# Patient Record
Sex: Male | Born: 1960
Health system: Southern US, Community
[De-identification: ages and names within clinical notes are randomized; demographics above are authoritative.]

## PROBLEM LIST (undated history)

## (undated) DIAGNOSIS — K5190 Ulcerative colitis, unspecified, without complications: Secondary | ICD-10-CM

## (undated) HISTORY — DX: Ulcerative colitis, unspecified, without complications: K51.90

---

## 2011-01-26 ENCOUNTER — Encounter (INDEPENDENT_AMBULATORY_CARE_PROVIDER_SITE_OTHER): Payer: Self-pay | Admitting: *Deleted

## 2011-02-02 NOTE — Letter (Signed)
Summary: New Patient letter  [facility]  [address] [place], [place] [address]   Phone: [phone]  Fax: [phone]       01/26/2011 MRN: [id number]  Christopher Haas [address] [place], [place]  [address]  Dear Christopher Haas,  Welcome to the Gastroenterology Division at [facility].    You are scheduled to see Dr.  Sheryn Bison on February 25, 2011 at 9:30am on the 3rd floor at [facility], [address]  We ask that you try to arrive at our office 15 minutes prior to your appointment time to allow for check-in.  We would like you to complete the enclosed self-administered evaluation form prior to your visit and bring it with you on the day of your appointment.  We will review it with you.  Also, please bring a complete list of all your medications or, if you prefer, bring the medication bottles and we will list them.  Please bring your insurance card so that we may make a copy of it.  If your insurance requires a referral to see a specialist, please bring your referral form from your primary care physician.  Co-payments are due at the time of your visit and may be paid by cash, check or credit card.     Your office visit will consist of a consult with your physician (includes a physical exam), any laboratory testing he/she may order, scheduling of any necessary diagnostic testing (e.g. x-ray, ultrasound, CT-scan), and scheduling of a procedure (e.g. Endoscopy, Colonoscopy) if required.  Please allow enough time on your schedule to allow for any/all of these possibilities.    If you cannot keep your appointment, please call [phone] to cancel or reschedule prior to your appointment date.  This allows [place] the opportunity to schedule an appointment for another patient in need of care.  If you do not cancel or reschedule by 5 p.m. the business day prior to your appointment date, you will be charged a $50.00 late cancellation/no-show fee.    Thank you for  choosing [facility] for your medical needs.  We appreciate the opportunity to care for you.  Please visit [place] at our website  to learn more about our practice.                     Sincerely,                                                             The Gastroenterology Division

## 2011-02-25 ENCOUNTER — Ambulatory Visit: Payer: Self-pay | Admitting: Gastroenterology

## 2011-06-15 DIAGNOSIS — K5100 Ulcerative (chronic) pancolitis without complications: Secondary | ICD-10-CM | POA: Insufficient documentation

## 2013-06-06 DIAGNOSIS — R1310 Dysphagia, unspecified: Secondary | ICD-10-CM | POA: Insufficient documentation

## 2013-08-25 ENCOUNTER — Ambulatory Visit (INDEPENDENT_AMBULATORY_CARE_PROVIDER_SITE_OTHER): Payer: BC Managed Care – PPO | Admitting: Family Medicine

## 2013-08-25 VITALS — BP 102/63 | HR 66 | Temp 97.1°F

## 2013-08-25 DIAGNOSIS — K5190 Ulcerative colitis, unspecified, without complications: Secondary | ICD-10-CM | POA: Insufficient documentation

## 2013-08-25 DIAGNOSIS — R059 Cough, unspecified: Secondary | ICD-10-CM

## 2013-08-25 DIAGNOSIS — J209 Acute bronchitis, unspecified: Secondary | ICD-10-CM | POA: Insufficient documentation

## 2013-08-25 DIAGNOSIS — R05 Cough: Secondary | ICD-10-CM

## 2013-08-25 DIAGNOSIS — J029 Acute pharyngitis, unspecified: Secondary | ICD-10-CM

## 2013-08-25 LAB — POCT INFLUENZA A/B
Influenza A, POC: NEGATIVE
Influenza B, POC: NEGATIVE

## 2013-08-25 LAB — POCT RAPID STREP A (OFFICE): Rapid Strep A Screen: NEGATIVE

## 2013-08-25 MED ORDER — AZITHROMYCIN 250 MG PO TABS: ORAL_TABLET | ORAL | Status: DC

## 2013-08-25 MED ORDER — GUAIFENESIN-CODEINE 100-10 MG/5ML PO SYRP: 5.0000 mL | ORAL_SOLUTION | Freq: Four times a day (QID) | ORAL | Status: DC | PRN

## 2013-08-25 NOTE — Progress Notes (Signed)
Patient ID: Christopher Haas, male   DOB: Dec 18, 1960, 52 y.o.   MRN: [id number] SUBJECTIVE: CC: Chief Complaint  Patient presents with  . Acute Visit    cough drainage sore throat    HPI: Lots of coughing, sorethroat,. No fever. Doesn't smoke.SOB : none No Chest pain. Cough worse at nights. This occurs every year at least once.   No past medical history on file. No past surgical history on file. History   Social History  . Marital Status: Married    Spouse Name: N/A    Number of Children: N/A  . Years of Education: N/A   Occupational History  . Not on file.   Social History Main Topics  . Smoking status: Not on file  . Smokeless tobacco: Not on file  . Alcohol Use: Not on file  . Drug Use: Not on file  . Sexual Activity: Not on file   Other Topics Concern  . Not on file   Social History Narrative  . No narrative on file   No family history on file. No current outpatient prescriptions on file prior to visit.   No current facility-administered medications on file prior to visit.   No Known Allergies  There is no immunization history on file for this patient. Prior to Admission medications   Medication Sig Start Date End Date Taking? Authorizing Provider  folic acid (FOLVITE) 1 MG tablet Take 1 mg by mouth daily.   Yes Historical Provider, MD  methotrexate (RHEUMATREX) 2.5 MG tablet Take by mouth. 10 tablets once a week   Yes Historical Provider, MD     ROS: As above in the HPI. All other systems are stable or negative.  OBJECTIVE: APPEARANCE:  Patient in no acute distress.The patient appeared well nourished and normally developed. Acyanotic. Waist: VITAL SIGNS:BP 102/63  Pulse 66  Temp(Src) 97.1 F (36.2 C) Hispanic male  SKIN: warm and  Dry without overt rashes, tattoos and scars  HEAD and Neck: without JVD, Head and scalp: normal Eyes:No scleral icterus. Fundi normal, eye movements normal. Ears: Auricle normal, canal normal, Tympanic membranes  normal, insufflation normal. Nose: normal Throat: red, no exudates Neck & thyroid: normal  CHEST & LUNGS: Chest wall: normal Lungs: Coarse BS, scattered rhonchi, no Rales  CVS: Reveals the PMI to be normally located. Regular rhythm, First and Second Heart sounds are normal,  absence of murmurs, rubs or gallops. Peripheral vasculature: Radial pulses: normal Dorsal pedis pulses: normal Posterior pulses: normal  ABDOMEN:  Appearance: normal Benign, no organomegaly, no masses, no Abdominal Aortic enlargement. No Guarding , no rebound. No Bruits. Bowel sounds: normal  RECTAL: N/A GU: N/A  EXTREMETIES: nonedematous.  MUSCULOSKELETAL:  Spine: normal Joints: intact  NEUROLOGIC: oriented to time,place and person; nonfocal. Strength is normal Sensory is normal Reflexes are normal Cranial Nerves are normal. Results for orders placed in visit on 08/25/13  POCT INFLUENZA A/B      Result Value Range   Influenza A, POC Negative     Influenza B, POC Negative    POCT RAPID STREP A (OFFICE)      Result Value Range   Rapid Strep A Screen Negative  Negative     ASSESSMENT: Cough - Plan: Influenza A/B  Sore throat - Plan: Influenza A/B, Rapid Strep A  Acute bronchitis - Plan: azithromycin (ZITHROMAX Z-PAK) 250 MG tablet, guaiFENesin-codeine (ROBITUSSIN AC) 100-10 MG/5ML syrup   PLAN:  Orders Placed This Encounter  Procedures  . Influenza A/B  . Rapid Strep A  Meds ordered this encounter  Medications  . folic acid (FOLVITE) 1 MG tablet    Sig: Take 1 mg by mouth daily.  . methotrexate (RHEUMATREX) 2.5 MG tablet    Sig: Take by mouth. 10 tablets once a week  . azithromycin (ZITHROMAX Z-PAK) 250 MG tablet    Sig: 2 tabs on day1 then 1 tab on day 2 to 5.    Dispense:  6 each    Refill:  0  . guaiFENesin-codeine (ROBITUSSIN AC) 100-10 MG/5ML syrup    Sig: Take 5 mLs by mouth 4 (four) times daily as needed for cough.    Dispense:  120 mL    Refill:  0     Discussed diet.  Return if symptoms worsen or fail to improve.  Charman Blasco P. Modesto Charon, M.D.

## 2013-09-04 ENCOUNTER — Ambulatory Visit (INDEPENDENT_AMBULATORY_CARE_PROVIDER_SITE_OTHER): Payer: BC Managed Care – PPO

## 2013-09-04 DIAGNOSIS — Z23 Encounter for immunization: Secondary | ICD-10-CM

## 2014-02-18 ENCOUNTER — Telehealth: Payer: Self-pay | Admitting: Family Medicine

## 2014-02-18 NOTE — Telephone Encounter (Signed)
Patient wanted late afternoon appt and i told them the first available was Wed with Christopher Haas appt given

## 2014-02-20 ENCOUNTER — Ambulatory Visit (INDEPENDENT_AMBULATORY_CARE_PROVIDER_SITE_OTHER): Payer: BC Managed Care – PPO | Admitting: Family Medicine

## 2014-02-20 ENCOUNTER — Encounter: Payer: Self-pay | Admitting: Family Medicine

## 2014-02-20 VITALS — BP 96/57 | HR 64 | Temp 97.7°F | Ht 67.0 in | Wt 165.2 lb

## 2014-02-20 DIAGNOSIS — J029 Acute pharyngitis, unspecified: Secondary | ICD-10-CM

## 2014-02-20 DIAGNOSIS — J302 Other seasonal allergic rhinitis: Secondary | ICD-10-CM

## 2014-02-20 DIAGNOSIS — R4789 Other speech disturbances: Secondary | ICD-10-CM

## 2014-02-20 DIAGNOSIS — J309 Allergic rhinitis, unspecified: Secondary | ICD-10-CM

## 2014-02-20 DIAGNOSIS — R4702 Dysphasia: Secondary | ICD-10-CM

## 2014-02-20 DIAGNOSIS — J209 Acute bronchitis, unspecified: Secondary | ICD-10-CM

## 2014-02-20 MED ORDER — DEXLANSOPRAZOLE 60 MG PO CPDR: 60.0000 mg | DELAYED_RELEASE_CAPSULE | Freq: Every day | ORAL | Status: DC

## 2014-02-20 MED ORDER — DESLORATADINE-PSEUDOEPHED ER 5-240 MG PO TB24: 1.0000 | ORAL_TABLET | Freq: Every day | ORAL | Status: DC

## 2014-02-20 MED ORDER — AZITHROMYCIN 250 MG PO TABS: ORAL_TABLET | ORAL | Status: DC

## 2014-02-20 NOTE — Progress Notes (Signed)
   Subjective:    Patient ID: Christopher Haas, male    DOB: 1960-12-02, 53 y.o.   MRN: [id number]  HPI  This 53 y.o. male presents for evaluation of sore throat.  He has been having some allergies. He c/o globulus sensation and dysphasia.  He c/o dryness in his throat.  Review of Systems No chest pain, SOB, HA, dizziness, vision change, N/V, diarrhea, constipation, dysuria, urinary urgency or frequency, myalgias, arthralgias or rash.     Objective:   Physical Exam  Vital signs noted  Well developed well nourished male.  HEENT - Head atraumatic Normocephalic                Eyes - PERRLA, Conjuctiva - clear Sclera- Clear EOMI                Ears - EAC's Wnl TM's Wnl Gross Hearing WNL                Nose - Nares patent                 Throat - oropharanx wnl Respiratory - Lungs CTA bilateral Cardiac - RRR S1 and S2 without murmur GI - Abdomen soft Nontender and bowel sounds active x 4 Extremities - No edema. Neuro - Grossly intact.      Assessment & Plan:  Acute bronchitis  Dysphasia - Plan: dexlansoprazole (DEXILANT) 60 MG capsule  Seasonal allergies - Plan: azithromycin (ZITHROMAX Z-PAK) 250 MG tablet, desloratadine-pseudoephedrine (CLARINEX-D 24 HOUR) 5-240 MG per 24 hr tablet  Acute pharyngitis - Plan: azithromycin (ZITHROMAX Z-PAK) 250 MG tablet  Deatra CanterWilliam J Con Arganbright FNP

## 2014-08-28 ENCOUNTER — Ambulatory Visit (INDEPENDENT_AMBULATORY_CARE_PROVIDER_SITE_OTHER): Payer: BC Managed Care – PPO

## 2014-08-28 DIAGNOSIS — Z23 Encounter for immunization: Secondary | ICD-10-CM

## 2015-02-18 ENCOUNTER — Encounter: Payer: Self-pay | Admitting: Family

## 2015-02-18 ENCOUNTER — Ambulatory Visit: Payer: Self-pay | Admitting: Family

## 2015-02-18 ENCOUNTER — Encounter (INDEPENDENT_AMBULATORY_CARE_PROVIDER_SITE_OTHER): Payer: Self-pay

## 2015-02-18 ENCOUNTER — Ambulatory Visit (INDEPENDENT_AMBULATORY_CARE_PROVIDER_SITE_OTHER): Payer: BLUE CROSS/BLUE SHIELD | Admitting: Family

## 2015-02-18 VITALS — BP 105/67 | HR 82 | Temp 98.7°F | Ht 67.0 in | Wt 163.2 lb

## 2015-02-18 DIAGNOSIS — J069 Acute upper respiratory infection, unspecified: Secondary | ICD-10-CM

## 2015-02-18 MED ORDER — AZITHROMYCIN 250 MG PO TABS: ORAL_TABLET | ORAL | Status: DC

## 2015-02-18 MED ORDER — HYDROCODONE-HOMATROPINE 5-1.5 MG/5ML PO SYRP: 5.0000 mL | ORAL_SOLUTION | Freq: Three times a day (TID) | ORAL | Status: DC | PRN

## 2015-02-18 MED ORDER — BENZONATATE 200 MG PO CAPS: 200.0000 mg | ORAL_CAPSULE | Freq: Three times a day (TID) | ORAL | Status: DC | PRN

## 2015-02-18 NOTE — Progress Notes (Signed)
Subjective:    Patient ID: Christopher Haas, male    DOB: [birth year], 54 y.o.   MRN: [id number]  Otalgia  Associated symptoms include coughing, headaches and rhinorrhea. Pertinent negatives include no sore throat.  Cough This is a new problem. The current episode started in the past 7 days (Saturday evening). The problem has been waxing and waning. The problem occurs every few minutes. The cough is productive of blood-tinged sputum. Associated symptoms include chills, ear pain, headaches, myalgias, nasal congestion, postnasal drip and rhinorrhea. Pertinent negatives include no fever, sore throat, shortness of breath or wheezing. He has tried rest and OTC cough suppressant for the symptoms. The treatment provided mild relief. There is no history of asthma or COPD.  Back Pain Associated symptoms include headaches. Pertinent negatives include no fever.      Review of Systems  Constitutional: Positive for chills. Negative for fever.  HENT: Positive for ear pain, postnasal drip and rhinorrhea. Negative for sore throat.   Respiratory: Positive for cough. Negative for shortness of breath and wheezing.   Cardiovascular: Negative.   Gastrointestinal: Negative.   Endocrine: Negative.   Genitourinary: Negative.   Musculoskeletal: Positive for myalgias and back pain.  Neurological: Positive for headaches.  Hematological: Negative.   Psychiatric/Behavioral: Negative.   All other systems reviewed and are negative.      Objective:   Physical Exam  Constitutional: He is oriented to person, place, and time. He appears well-developed and well-nourished. No distress.  HENT:  Head: Normocephalic.  Right Ear: External ear normal.  Left Ear: External ear normal.  Nasal passage erythemas with mild swelling  Oropharynx erythemas  Eyes: Pupils are equal, round, and reactive to light. Right eye exhibits no discharge. Left eye exhibits no discharge.  Neck: Normal range of motion. Neck supple. No  thyromegaly present.  Cardiovascular: Normal rate, regular rhythm, normal heart sounds and intact distal pulses.   No murmur heard. Pulmonary/Chest: Effort normal and breath sounds normal. No respiratory distress. He has no wheezes.  Abdominal: Soft. Bowel sounds are normal. He exhibits no distension. There is no tenderness.  Musculoskeletal: Normal range of motion. He exhibits no edema or tenderness.  Neurological: He is alert and oriented to person, place, and time. He has normal reflexes. No cranial nerve deficit.  Skin: Skin is warm and dry. No rash noted. No erythema.  Psychiatric: He has a normal mood and affect. His behavior is normal. Judgment and thought content normal.  Vitals reviewed.     BP 105/67 mmHg  Pulse 82  Temp(Src) 98.7 F (37.1 C) (Oral)  Ht  (1.702 m)  Wt 163 lb 3.2 oz (74.027 kg)  BMI 25.55 kg/m2  SpO2 97%     Assessment & Plan:  1. Acute upper respiratory infection - Take meds as prescribed - Use a cool mist humidifier  -Use saline nose sprays frequently -Saline irrigations of the nose can be very helpful if done frequently.  * 4X daily for 1 week*  * Use of a nettie pot can be helpful with this. Follow directions with this* -Force fluids -For any cough or congestion  Use plain Mucinex- regular strength or max strength is fine   * Children- consult with Pharmacist for dosing -For fever or aces or pains- take tylenol or ibuprofen appropriate for age and weight.  * for fevers greater than 101 orally you may alternate ibuprofen and tylenol every  3 hours. -Throat lozenges if help - azithromycin (ZITHROMAX) 250 MG tablet; Take 500 mg  once, then 250 mg for four days  Dispense: 6 tablet; Refill: 0 - benzonatate (TESSALON) 200 MG capsule; Take 1 capsule (200 mg total) by mouth 3 (three) times daily as needed.  Dispense: 30 capsule; Refill: 1 - HYDROcodone-homatropine (HYCODAN) 5-1.5 MG/5ML syrup; Take 5 mLs by mouth every 8 (eight) hours as needed for  cough.  Dispense: 120 mL; Refill: 0  Jannifer Rodneyhristy Kemoni Ortega, FNP

## 2015-02-18 NOTE — Patient Instructions (Addendum)
Infeccin de las vas areas superiores en los adultos (Upper Respiratory Infection, Adult)  La infeccin respiratoria de las vas areas superiores se conoce tambin como resfro comn. Las vas areas superiores incluyen los senos nasales, la garganta, la trquea, y los bronquios. Los bronquios son las vas areas que conducen el aire a los pulmones. La mayor parte de las personas mejora luego de una semana, pero los sntomas pueden durar hasta dos semanas. La tos residual puede durar ms. CAUSAS Varios tipos de virus pueden causar la infeccin de los tejidos que cubren las vas areas superiores. Los tejidos se irritan y se inflaman y se originan secreciones. Tambin es frecuente la produccin de moco. El resfro es contagioso. El virus se disemina fcilmente a otras personas por contacto oral. Aqu se incluyen los besos, el compartir un vaso y el toser o estornudar. Tambin puede diseminarse tocndose la boca o la nariz y luego tocando una superficie que luego tocan otras personas.  SNTOMAS Los sntomas se desarrollan entre uno y tres das luego de entrar en contacto con el virus. Pueden variar de una persona a otra. Incluyen:  Secrecin nasal.  Estornudos  Congestin nasal.  Irritacin de los senos nasales.  Dolor de garganta.  Prdida de la voz (laringitis).  Tos.  Fatiga.  Dolores musculares.  Prdida del apetito.  Dolor de cabeza.  Fiebre no muy elevada. DIAGNSTICO Puede diagnosticarse a s mismo la infeccin respiratoria, segn los sntomas habituales, ya que la mayor parte de las personas se resfra dos o tres veces al ao. El profesional puede confirmarlo basndose en el examen fsico. Lo ms importante es que el profesional verifique que los sntomas no se deben a otra enfermedad como anginas, sinusitis, neumona, asma o epiglotitis. Para diagnosticar el resfrio comn, no es necesario que haga anlisis de sangre, pruebas en la garganta o radiografas, pero en algunos  casos puede ser de utilidad para excluir otros problemas ms graves. El mdico decidir si necesita otras pruebas. RIESGOS Y COMPLICACIONES Tendr mayor riesgo de sufrir un resfro grave si consume cigarrillos, sufre una enfermedad cardaca (como insuficiencia cardaca) o pulmonar crnica (como asma) o si tiene un debilitamiento del sistema inmunolgico. Las personas muy jvenes o muy mayores tienen riesgo de sufrir infecciones ms graves. La sinusitis bacteriana, las infecciones del odo medio y la neumona bacteriana pueden complicar el resfro comn. El resfro puede exacerbar el asma y la enfermedad pulmonar obstructiva crnica. En algunos casos estas complicaciones requieren la atencin en un servicio de emergencias y pueden poner en peligro la vida. PREVENCIN La mejor manera de protegerse para no contraer un resfro es mantener una buena higiene. Evite el contacto bucal o de las manos con personas con sntomas de resfro. Si se produce el contacto, lvese las manos con frecuencia. No hay pruebas firmes que indiquen que la vitamina C, la vitamina E, la equincea o la actividad fsica reduzcan las posibilidades de tener una infeccin. Sin embargo, siempre se recomienda descansar mucho y tener una buena nutricin. TRATAMIENTO El tratamiento est dirigido a aliviar los sntomas. Esta enfermedad no tiene cura. Los antibiticos no son eficaces, ya que esta infeccin la causa un virus y no una bacteria. El tratamiento incluye:  Aumente la ingesta de lquidos. Consumo de bebidas deportivas, que proporcionan electrolitos,azcares e hidratacin.  Inhale vapor caliente (de un vaporizador o de la ducha).  Tomar sopa de pollo u otros lquidos claros, y mantener una buena nutricin.  Descanse lo suficiente.  Haga grgaras o coma pastillas para aliviar   las molestias.  Control de la fiebre con ibuprofeno o acetaminofen, segn las indicaciones del mdico.  Aumento del uso del inhalador, si sufre asma. Las  pastillas y los geles de zinc durante las primeras 24 horas de iniciado el resfro comn, pueden disminuir la duracin y Paramedicaliviar la gravedad de los sntomas. Los medicamentos para Chief Technology Officerel dolor pueden disminuir la fiebre, Paramedicaliviar los dolores musculares y Chief Technology Officerel dolor de Advertising copywritergarganta. Se dispone de una gran variedad de medicamentos de venta libre para tratar la congestin y la secrecin nasal. El profesional podr recomendarle inhalantes para los otros sntomas. INSTRUCCIONES PARA EL CUIDADO DOMICILIARIO  Utilice los medicamentos de venta libre o de prescripcin para Chief Technology Officerel dolor, el malestar o la [place]bre, segn se lo indique el profesional que lo asiste.  Utilice un vaporizador caliente o inhale vapor, haciendo salir agua de la ducha para aumentar la humedad McChord AFBambiente. Esto mantendr las secreciones hmedas y Community education officerle resultar ms fcil respirar.  Beba gran cantidad de lquido para mantener la orina de tono claro o color amarillo plido.  Descanse todo lo que pueda.  Regrese a su trabajo cuando la temperatura se haya normalizado, o cuando el profesional que lo asiste se lo indique. Quizs sea necesario que permanezca en su casa durante un tiempo ms prolongado para Buyer, retailevitar infectar a Economistotras personas. Tambin puede utilizar un barbijo y ser cuidadoso con el lavado de manos para evitar la diseminacin del virus. SOLICITE ATENCIN MDICA SI:  Luego de los primeros das siente que empeora en vez de [place] Citymejorar.  Necesita que Occupational psychologistel profesional le brinde ms informacin relacionada con los medicamentos para AGCO Corporationcontrolar los sntomas.  Siente escalofros, le falta el aire o escupe moco de color marrn o rojo. Estos pueden ser sntomas de neumona.  Tiene una secrecin nasal de color amarillo o marrn, o siente dolor en el rostro, especialmente cuando se inclina hacia adelante. Estos pueden ser sntomas de sinusitis.  Tiene fiebre, siente el cuello hinchado, tiene dolor al tragar u observa manchas blancas en el fondo de la garganta.  Estos pueden ser sntomas de angina por estreptococo. Romona CurlsSOLICITE ATENCIN MDICA DE INMEDIATO SI:  Lance Mussiene fiebre.  Comienza a sentir Herbalistun dolor de cabeza intenso o persistente, dolor de odos, en el seno nasal o en el pecho.  Tiene tos y esta se prolonga demasiado, tose y escupe sangre, la mucosidad habitual se modifica (si tiene una enfermedad pulmonar crnica) o respira con dificultad.  Siente rigidez en el cuello o dolor de cabeza intenso. Document Released: 08/18/2005 Document Revised: 01/31/2012 [facility] Patient Information 2015 [place], [place]. This information is not intended to replace advice given to you by your health care provider. Make sure you discuss any questions you have with your health care provider.  - Take meds as prescribed - Use a cool mist humidifier  -Use saline nose sprays frequently -Saline irrigations of the nose can be very helpful if done frequently.  * 4X daily for 1 week*  * Use of a nettie pot can be helpful with this. Follow directions with this* -Force fluids -For any cough or congestion  Use plain Mucinex- regular strength or max strength is fine   * Children- consult with Pharmacist for dosing -For fever or aces or pains- take tylenol or ibuprofen appropriate for age and weight.  * for fevers greater than 101 orally you may alternate ibuprofen and tylenol every  3 hours. -Throat lozenges if help -New toothbrush in 3 days   Jannifer Rodneyhristy Rianne Degraaf, FNP

## 2015-09-12 ENCOUNTER — Ambulatory Visit (INDEPENDENT_AMBULATORY_CARE_PROVIDER_SITE_OTHER): Payer: BLUE CROSS/BLUE SHIELD | Admitting: *Deleted

## 2015-09-12 DIAGNOSIS — Z23 Encounter for immunization: Secondary | ICD-10-CM

## 2016-01-15 ENCOUNTER — Encounter: Payer: Self-pay | Admitting: Family Medicine

## 2016-01-15 ENCOUNTER — Ambulatory Visit (INDEPENDENT_AMBULATORY_CARE_PROVIDER_SITE_OTHER): Payer: BLUE CROSS/BLUE SHIELD | Admitting: Family Medicine

## 2016-01-15 VITALS — BP 103/72 | HR 83 | Temp 98.1°F | Ht 67.0 in | Wt 166.0 lb

## 2016-01-15 DIAGNOSIS — R52 Pain, unspecified: Secondary | ICD-10-CM | POA: Diagnosis not present

## 2016-01-15 DIAGNOSIS — J111 Influenza due to unidentified influenza virus with other respiratory manifestations: Secondary | ICD-10-CM

## 2016-01-15 LAB — POCT INFLUENZA A/B
Influenza A, POC: NEGATIVE
Influenza B, POC: POSITIVE — AB

## 2016-01-15 MED ORDER — BENZONATATE 100 MG PO CAPS: 100.0000 mg | ORAL_CAPSULE | Freq: Two times a day (BID) | ORAL | Status: DC | PRN

## 2016-01-15 MED ORDER — OSELTAMIVIR PHOSPHATE 75 MG PO CAPS: 75.0000 mg | ORAL_CAPSULE | Freq: Two times a day (BID) | ORAL | Status: DC

## 2016-01-15 NOTE — Progress Notes (Signed)
BP 103/72 mmHg  Pulse 83  Temp(Src) 98.1 F (36.7 C) (Oral)  Ht  (1.702 m)  Wt 166 lb (75.297 kg)  BMI 25.99 kg/m2   Subjective:    Patient ID: Christopher Haas, male    DOB: [birth year], 55 y.o.   MRN: [id number]  HPI: Christopher Haas is a 54 y.o. male presenting on 01/15/2016 for Cough; Generalized Body Aches; Fever; and Sore Throat   HPI Cough and fever and body aches and sore throat Patient has been having cough and fever and body aches and sore throat for the past day and a half. He denies any shortness of breath or wheezing. Patient has had a cough productive of yellow-green sputum. He has not taken the fever at home but today here it is normal.  Relevant past medical, surgical, family and social history reviewed and updated as indicated. Interim medical history since our last visit reviewed. Allergies and medications reviewed and updated.  Review of Systems  Constitutional: Positive for fever and chills.  HENT: Positive for congestion, postnasal drip, rhinorrhea, sinus pressure, sneezing and sore throat. Negative for ear discharge, ear pain and voice change.   Eyes: Negative for pain, discharge, redness and visual disturbance.  Respiratory: Negative for shortness of breath and wheezing.   Cardiovascular: Negative for chest pain and leg swelling.  Gastrointestinal: Negative for abdominal pain, diarrhea and constipation.  Genitourinary: Negative for difficulty urinating.  Musculoskeletal: Negative for back pain and gait problem.  Skin: Negative for rash.  Neurological: Negative for syncope, light-headedness and headaches.  All other systems reviewed and are negative.   Per HPI unless specifically indicated above     Medication List       This list is accurate as of: 01/15/16 12:27 PM.  Always use your most recent med list.               benzonatate 100 MG capsule  Commonly known as:  TESSALON  Take 1 capsule (100 mg total) by mouth 2 (two) times daily as  needed for cough.     folic acid 1 MG tablet  Commonly known as:  FOLVITE  Take 1 mg by mouth daily.     methotrexate 2.5 MG tablet  Commonly known as:  RHEUMATREX  Take by mouth. 10 tablets once a week     oseltamivir 75 MG capsule  Commonly known as:  TAMIFLU  Take 1 capsule (75 mg total) by mouth 2 (two) times daily.     REMICADE 100 MG injection  Generic drug:  inFLIXimab  Inject into the vein. Every 2 months           Objective:    BP 103/72 mmHg  Pulse 83  Temp(Src) 98.1 F (36.7 C) (Oral)  Ht  (1.702 m)  Wt 166 lb (75.297 kg)  BMI 25.99 kg/m2  Wt Readings from Last 3 Encounters:  01/15/16 166 lb (75.297 kg)  02/18/15 163 lb 3.2 oz (74.027 kg)  02/20/14 165 lb 3.2 oz (74.934 kg)    Physical Exam  Constitutional: He is oriented to person, place, and time. He appears well-developed and well-nourished. No distress.  HENT:  Right Ear: Tympanic membrane, external ear and ear canal normal.  Left Ear: Tympanic membrane, external ear and ear canal normal.  Nose: Mucosal edema and rhinorrhea present. No sinus tenderness. No epistaxis. Right sinus exhibits maxillary sinus tenderness. Right sinus exhibits no frontal sinus tenderness. Left sinus exhibits maxillary sinus tenderness. Left sinus exhibits no frontal sinus  tenderness.  Mouth/Throat: Uvula is midline and mucous membranes are normal. Posterior oropharyngeal edema and posterior oropharyngeal erythema present. No oropharyngeal exudate or tonsillar abscesses.  Eyes: Conjunctivae and EOM are normal. Pupils are equal, round, and reactive to light. Right eye exhibits no discharge. No scleral icterus.  Cardiovascular: Normal rate, regular rhythm, normal heart sounds and intact distal pulses.   No murmur heard. Pulmonary/Chest: Effort normal and breath sounds normal. No respiratory distress. He has no wheezes.  Abdominal: He exhibits no distension.  Musculoskeletal: Normal range of motion. He exhibits no edema.    Neurological: He is alert and oriented to person, place, and time. Coordination normal.  Skin: Skin is warm and dry. No rash noted. He is not diaphoretic.  Psychiatric: He has a normal mood and affect. His behavior is normal.  Nursing note and vitals reviewed.   Results for orders placed or performed in visit on 01/15/16  POCT Influenza A/B  Result Value Ref Range   Influenza A, POC Negative Negative   Influenza B, POC Positive (A) Negative      Assessment & Plan:   Problem List Items Addressed This Visit    None    Visit Diagnoses    Body aches    -  Primary    Relevant Medications    oseltamivir (TAMIFLU) 75 MG capsule    benzonatate (TESSALON) 100 MG capsule    Other Relevant Orders    POCT Influenza A/B (Completed)    Influenza with respiratory manifestation        Relevant Medications    oseltamivir (TAMIFLU) 75 MG capsule    benzonatate (TESSALON) 100 MG capsule       Follow up plan: Return if symptoms worsen or fail to improve.  Counseling provided for all of the vaccine components Orders Placed This Encounter  Procedures  . POCT Influenza A/B    Arville Care, MD [facility] 01/15/2016, 12:27 PM

## 2016-11-24 ENCOUNTER — Ambulatory Visit (INDEPENDENT_AMBULATORY_CARE_PROVIDER_SITE_OTHER): Payer: BLUE CROSS/BLUE SHIELD | Admitting: *Deleted

## 2016-11-24 DIAGNOSIS — Z23 Encounter for immunization: Secondary | ICD-10-CM

## 2016-12-24 ENCOUNTER — Ambulatory Visit (INDEPENDENT_AMBULATORY_CARE_PROVIDER_SITE_OTHER): Payer: BLUE CROSS/BLUE SHIELD | Admitting: Family

## 2016-12-24 ENCOUNTER — Encounter: Payer: Self-pay | Admitting: Family

## 2016-12-24 VITALS — BP 117/73 | HR 73 | Temp 98.5°F | Ht 67.0 in | Wt 175.0 lb

## 2016-12-24 DIAGNOSIS — R6889 Other general symptoms and signs: Secondary | ICD-10-CM | POA: Diagnosis not present

## 2016-12-24 LAB — VERITOR FLU A/B WAIVED
INFLUENZA A: NEGATIVE
Influenza B: NEGATIVE

## 2016-12-24 MED ORDER — OSELTAMIVIR PHOSPHATE 75 MG PO CAPS: 75.0000 mg | ORAL_CAPSULE | Freq: Two times a day (BID) | ORAL | 0 refills | Status: DC

## 2016-12-24 NOTE — Progress Notes (Signed)
   Subjective:    Patient ID: Christopher Haas CommentGuillermo Givan, male    DOB: 06-Jun-1961, 56 y.o.   MRN: [id number]  Sore Throat   This is a new problem. The current episode started in the past 7 days (Tuesday). The problem has been unchanged. There has been no fever. The pain is at a severity of 4/10. The pain is mild. Associated symptoms include congestion, coughing, headaches and trouble swallowing. Pertinent negatives include no ear discharge or ear pain. He has tried gargles and acetaminophen for the symptoms. The treatment provided mild relief.  Cough  Associated symptoms include headaches. Pertinent negatives include no ear pain.      Review of Systems  HENT: Positive for congestion and trouble swallowing. Negative for ear discharge and ear pain.   Respiratory: Positive for cough.   Neurological: Positive for headaches.  All other systems reviewed and are negative.      Objective:   Physical Exam  Constitutional: He is oriented to person, place, and time. He appears well-developed and well-nourished. He appears ill. No distress.  HENT:  Head: Normocephalic.  Right Ear: External ear normal.  Left Ear: External ear normal.  Nose: Mucosal edema and rhinorrhea present.  Mouth/Throat: Posterior oropharyngeal erythema present.  Eyes: Pupils are equal, round, and reactive to light. Right eye exhibits no discharge. Left eye exhibits no discharge.  Neck: Normal range of motion. Neck supple. No thyromegaly present.  Cardiovascular: Normal rate, regular rhythm, normal heart sounds and intact distal pulses.   No murmur heard. Pulmonary/Chest: Effort normal and breath sounds normal. No respiratory distress. He has no wheezes.  Abdominal: Soft. Bowel sounds are normal. He exhibits no distension. There is no tenderness.  Musculoskeletal: Normal range of motion. He exhibits no edema or tenderness.  Neurological: He is alert and oriented to person, place, and time. He has normal reflexes. No cranial nerve  deficit.  Skin: Skin is warm and dry. No rash noted. No erythema.  Psychiatric: He has a normal mood and affect. His behavior is normal. Judgment and thought content normal.  Vitals reviewed.     BP 117/73   Pulse 73   Temp 98.5 F (36.9 C) (Oral)   Ht 5\' 7"  (1.702 m)   Wt 175 lb (79.4 kg)   BMI 27.41 kg/m      Assessment & Plan:  1. Flu-like symptoms -Force fluids -Rest -Good hand hygiene -Tylenol prn  - Veritor Flu A/B Waived - oseltamivir (TAMIFLU) 75 MG capsule; Take 1 capsule (75 mg total) by mouth 2 (two) times daily.  Dispense: 10 capsule; Refill: 0  Jannifer Rodneyhristy Larron Armor, FNP

## 2016-12-24 NOTE — Patient Instructions (Addendum)
Gripe en los adultos °(Influenza, Adult) °La gripe es una infección viral que afecta principalmente las vías respiratorias. Las vías respiratorias incluyen órganos que ayudan a respirar, como los pulmones, la nariz y la garganta. La gripe provoca muchos síntomas del resfrío común, así como fiebre alta y dolor corporal. °Se transmite fácilmente de persona a persona (es contagiosa). La mejor manera de prevenir la gripe es aplicándose la vacuna contra la gripe todos los años. °CAUSAS °La gripe es causada por un virus. Se puede contagiar el virus de las siguientes maneras: °· Al aspirar las gotitas que una persona infectada elimina al toser o estornudar. °· Al tocar algo que fue recientemente contaminado con el virus y luego llevarse la mano a la boca, la nariz o los ojos. °FACTORES DE RIESGO °Los siguientes factores pueden hacer que usted sea propenso a contraer la gripe: °· No lavarse las manos frecuentemente con agua y jabón o un desinfectante de manos a base de alcohol. °· Tener contacto cercano con muchas personas durante la temporada de resfrío y gripe. °· Tocarse la boca, los ojos o la nariz sin lavarse ni desinfectarse las manos primero. °· No beber suficientes líquidos o no seguir una dieta saludable. °· No dormir lo suficiente o no hacer suficiente actividad física. °· Tener un alto grado de estrés. °· No colocarse la vacuna anual contra la gripe. °Puede correr un mayor riesgo de complicaciones de la gripe, como una infección pulmonar grave (neumonía) si: °· Tiene más de 65 años. °· Está embarazada. °· Tiene un sistema que combate las defensas (sistema inmunitario) debilitado. Puede tener un sistema inmunitario debilitado si: °? Tiene VIH o sida. °? Está recibiendo quimioterapia. °? Usa medicamentos que reducen la actividad (suprimen) del sistema inmunitario. °· Tiene una enfermedad a largo plazo (crónica), como cardiopatía coronaria, enfermedad renal, diabetes o enfermedad pulmonar. °· Tiene un trastorno  hepático. °· Son obesas. °· Tiene anemia. °SÍNTOMAS °Los síntomas de esta afección por lo general duran entre 4 y 10 días, y pueden tener los siguientes síntomas: °· Fiebre. °· Escalofríos. °· Dolor de cabeza, dolores en el cuerpo o dolores musculares. °· Dolor de garganta. °· Tos. °· Secreción o congestión nasal. °· Molestias en el pecho y tos. °· Pérdida del apetito. °· Debilidad o cansancio (fatiga). °· Mareos. °· Náuseas o vómitos. °DIAGNÓSTICO °Esta afección se puede diagnosticar en función de los antecedentes médicos y un examen físico. El médico puede indicarle un cultivo faríngeo o nasal para confirmar el diagnóstico. °TRATAMIENTO °Si la gripe se detecta de manera temprana, puede recibir tratamiento con medicamentos antivirales que pueden reducir la duración de la enfermedad y la gravedad de los síntomas. Este medicamento se puede administrar por vía oral o por vía intravenosa (IV), es decir, a través de un tubo que se coloca en una vena. °El objetivo del tratamiento es aliviar los síntomas cuidándose en su casa. Esto puede incluir usar medicamentos de venta libre, beber una gran cantidad de líquidos y agregar humedad al aire en su casa. °En algunos casos, la gripe se cura sin tratamiento. Los casos graves o las complicaciones de gripe se pueden tratar en un hospital. °INSTRUCCIONES PARA EL CUIDADO EN EL HOGAR °· Tome los medicamentos de venta libre y los recetados solamente como se lo haya indicado el médico. °· Use un humidificador de aire frío para agregar humedad al aire de su casa. Esto puede ayudarlo a respirar mejor. °· Descanse todo lo que sea necesario. °· Beba suficiente líquido para mantener la orina clara o de color   amarillo pálido. °· Al toser o estornudar, cúbrase la boca y la nariz. °· Lávese las manos con agua y jabón frecuentemente, en especial después de toser o estornudar. Use desinfectante para manos si no dispone de agua y jabón. °· Quédese en su casa y no concurra al trabajo o a la  escuela, como se lo haya indicado el médico. A menos que visite al médico, evite salir de su casa hasta que no tenga fiebre durante 24 horas sin el uso de medicamentos. °· Concurra a todas las visitas de control como se lo haya indicado el médico. Esto es importante. ° °PREVENCIÓN °· Aplicarse la vacuna anual contra la gripe es la mejor manera de evitar contagiarse la gripe. Puede aplicarse la vacuna contra la gripe a fines de verano, en otoño o en invierno. Pregúntele al médico cuándo debe aplicarse la vacuna contra la gripe. °· Lávese las manos o use un desinfectante de manos con frecuencia. °· Evite el contacto con personas que están enfermas durante la temporada de resfrío y gripe. °· Siga una dieta saludable, beba muchos líquidos, duerma lo suficiente y realice actividad física con regularidad. ° °SOLICITE ATENCIÓN MÉDICA SI: °· Presenta nuevos síntomas. °· Tiene los siguientes síntomas: °? Dolor en el pecho. °? Diarrea. °? Fiebre. °· La tos empeora. °· Produce más mucosidad. °· Siente náuseas o vomita. ° °SOLICITE ATENCIÓN MÉDICA DE INMEDIATO SI: °· Le falta el aire o tiene dificultad para respirar. °· La piel o las uñas se tornan de un color azulado. °· Presenta dolor intenso o rigidez de cuello. °· Le duele la cabeza de forma repentina o tiene dolor en la cara o el oído. °· No puede dejar de vomitar. ° °Esta información no tiene como fin reemplazar el consejo del médico. Asegúrese de hacerle al médico cualquier pregunta que tenga. °Document Released: 08/18/2005 Document Revised: 03/01/2016 Document Reviewed: 09/02/2015 °Elsevier Interactive Patient Education © 2017 Elsevier Inc[employer] ° °

## 2017-03-12 ENCOUNTER — Ambulatory Visit: Payer: BLUE CROSS/BLUE SHIELD

## 2017-03-12 ENCOUNTER — Ambulatory Visit (INDEPENDENT_AMBULATORY_CARE_PROVIDER_SITE_OTHER): Payer: BLUE CROSS/BLUE SHIELD | Admitting: Family Medicine

## 2017-03-12 VITALS — BP 107/73 | HR 80 | Temp 98.6°F | Ht 67.0 in | Wt 173.0 lb

## 2017-03-12 DIAGNOSIS — R61 Generalized hyperhidrosis: Secondary | ICD-10-CM | POA: Diagnosis not present

## 2017-03-12 DIAGNOSIS — J209 Acute bronchitis, unspecified: Secondary | ICD-10-CM | POA: Diagnosis not present

## 2017-03-12 DIAGNOSIS — J301 Allergic rhinitis due to pollen: Secondary | ICD-10-CM

## 2017-03-12 MED ORDER — AZITHROMYCIN 250 MG PO TABS: ORAL_TABLET | ORAL | 0 refills | Status: DC

## 2017-03-12 NOTE — Patient Instructions (Addendum)
Great to see you!  Try a plain zyrtec 1 pill once daily to help your runny nose.    Be sure to finish all antibiotics  Come back if your night sweats do not improve for further evaluation.

## 2017-03-12 NOTE — Progress Notes (Signed)
   HPI  Patient presents today here with cough.  Patient explains he's had cough for about 3 days. He states that it's nonproductive and he does not have any shortness of breath. He also states that he has runny nose for about one month.  Patient explains that he feels like his chest is congested and he has fullness in his chest. He is on Remicade for ulcerative colitis.  He complains of night sweats for about one month.  PMH: Smoking status noted ROS: Per HPI  Objective: BP 107/73   Pulse 80   Temp 98.6 F (37 C) (Oral)   Ht  (1.702 m)   Wt 173 lb (78.5 kg)   BMI 27.10 kg/m  Gen: NAD, alert, cooperative with exam HEENT: NCAT, TMs normal bilaterally, oropharynx clear, nares with boggy mucosa and swollen turbinates CV: RRR, good S1/S2, no murmur Resp: CTABL, no wheezes, non-labored Ext: No edema, warm Neuro: Alert and oriented, No gross deficits  Assessment and plan:  # Acute bronchitis Given that he is immunosuppressed with methotrexate and Remicade I have chosen to treat more aggressively with azithromycin. Low threshold for return if symptoms worsen or do not improve as expected  # Seasonal allergic rhinitis Patient with a few weeks of runny nose, recommended starting plain Zyrtec.   # Night sweats Patient reports 3-4 weeks of night sweats. I recommended that he finish azithromycin and see if he improves. Given that he is from [place] treated with Remicade I believe he is high-risk for TV than average. I recommended coming back to see his physician within a few weeks if his night sweats do not improve after treatment.  Meds ordered this encounter  Medications  . azithromycin (ZITHROMAX) 250 MG tablet    Sig: Take 2 tablets on day 1 and 1 tablet daily after that    Dispense:  6 tablet    Refill:  0    Murtis Sink, MD [place] 03/12/2017, 11:12 AM

## 2017-04-05 ENCOUNTER — Encounter: Payer: Self-pay | Admitting: Family

## 2017-04-05 ENCOUNTER — Ambulatory Visit (INDEPENDENT_AMBULATORY_CARE_PROVIDER_SITE_OTHER): Payer: BLUE CROSS/BLUE SHIELD | Admitting: Family

## 2017-04-05 VITALS — BP 114/72 | HR 78 | Temp 98.5°F | Ht 67.0 in | Wt 173.2 lb

## 2017-04-05 DIAGNOSIS — J209 Acute bronchitis, unspecified: Secondary | ICD-10-CM | POA: Diagnosis not present

## 2017-04-05 MED ORDER — AZITHROMYCIN 250 MG PO TABS: ORAL_TABLET | ORAL | 0 refills | Status: DC

## 2017-04-05 NOTE — Patient Instructions (Signed)
Bronquitis aguda en los adultos (Acute Bronchitis, Adult) La bronquitis aguda es la hinchazn repentina (aguda) de las vas areas (bronquios) en los pulmones. La bronquitis aguda hace que se llenen estas vas con mucosidad y provoca dificultad para respirar. Tambin puede causar tos o sibilancias. En los adultos, la bronquitis aguda generalmente desaparece en 2semanas. La tos provocada por la bronquitis puede durar hasta 3semanas. El hbito de fumar, las alergias y el asma pueden empeorar esta afeccin. Los episodios repetidos de bronquitis pueden causar ms problemas pulmonares, como la enfermedad pulmonar obstructiva crnica (EPOC). CAUSAS Esta afeccin puede ser causada por grmenes y por sustancias que irritan los pulmones, por ejemplo:  Virus del resfro y de la gripe. La causa de la bronquitis suele ser el mismo virus que produce el resfro.  Bacterias.  Exposicin al humo del tabaco, polvo, gases y contaminacin del aire. FACTORES DE RIESGO Es ms probable que esta afeccin se manifieste en las personas que:  Tienen contacto cercano con alguien con bronquitis aguda.  Estn expuestas a sustancias que irritan los pulmones, como el humo del tabaco, polvo, gases y vapores.  Tienen un sistema inmunitario dbil.  Tienen una afeccin respiratoria, como el asma. SNTOMAS Los sntomas de esta afeccin incluyen lo siguiente:  Tos.  Despedir una mucosidad transparente, amarilla o verde al toser.  Sibilancias.  Congestin en el pecho.  Falta de aire.  Fiebre.  Dolores en el cuerpo.  Escalofros.  Dolor de garganta. DIAGNSTICO Por lo general, esta afeccin se diagnostica mediante un examen fsico. Durante el examen, el mdico puede solicitar estudios, como radiografas de trax, para descartar otras afecciones. El mdico tambin puede hacer lo siguiente:  Analizar una muestra de mucosidad para detectar una infeccin bacteriana.  Confirmar el nivel de oxgeno en la sangre.  Esto se hace para determinar si hay neumona.  Realizar una radiografa de trax o pruebas de la funcin pulmonar para descartar una neumona u otras afecciones.  Le pedir anlisis de sangre. El mdico tambin le preguntar sobre sus sntomas y los antecedentes mdicos. TRATAMIENTO La mayora de los casos de bronquitis aguda se recupera con el tiempo, sin tratamiento. El mdico podr indicar lo siguiente:  Beber ms lquidos. Beber ms cantidad de lquidos ayuda a diluir la mucosidad para facilitar la respiracin.  Tomar un medicamento para la fiebre o la tos.  Tomar un antibitico.  Usar un inhalador para respirar mejor y controlar la tos.  Usar un humidificador o vaporizador de niebla fra para facilitar la respiracin. INSTRUCCIONES PARA EL CUIDADO EN EL HOGAR Medicamentos  Tome los medicamentos de venta libre y los recetados solamente como se lo haya indicado el mdico.  Si le recetaron un antibitico, tmelo como se lo haya indicado el mdico. No deje de tomar los antibiticos aunque comience a sentirse mejor. Instrucciones generales  Descanse lo suficiente.  Beba suficiente lquido para mantener la orina clara o de color amarillo plido.  Evite fumar o aspirar el humo de otros fumadores. La exposicin al humo del cigarrillo o a irritantes qumicos har que la bronquitis empeore. Si fuma y necesita ayuda para dejar de fumar, consulte al mdico. Si deja de fumar, sus pulmones se curarn ms rpido.  Utilice un inhalador, un humidificador o un vaporizador de niebla fra, como se lo haya indicado el mdico.  Concurra a todas las visitas de control como se lo haya indicado el mdico. Esto es importante. PREVENCIN Para disminuir el riesgo de volver a sufrir esta afeccin:  Lave sus manos frecuentemente   con agua y jabn. Use desinfectante para manos si no dispone de agua y jabn.  Evite el contacto con personas que tienen sntomas de resfro.  Trate de no llevar las manos a  la boca, la nariz o los ojos.  Recuerde aplicarse la vacuna contra la gripe todos los aos. SOLICITE ATENCIN MDICA SI:  Los sntomas no mejoran despus de 2semanas de tratamiento.  SOLICITE ATENCIN MDICA DE INMEDIATO SI:  Tose y escupe sangre.  Siente dolor en el pecho.  Le falta el aire de manera preocupante.  Se deshidrata.  Se desmaya o se siente como si se fuera a desmayar.  Sigue vomitando.  Tiene un dolor de cabeza intenso.  La fiebre o los escalofros empeoran.  Esta informacin no tiene como fin reemplazar el consejo del mdico. Asegrese de hacerle al mdico cualquier pregunta que tenga. Document Released: 11/08/2005 Document Revised: 11/29/2014 Document Reviewed: 04/28/2016 Elsevier Interactive Patient Education  2017 Elsevier Inc.  

## 2017-04-05 NOTE — Progress Notes (Signed)
   Subjective:    Patient ID: Marin CommentGuillermo Chay, male    DOB: 1961-06-21, 56 y.o.   MRN: [id number]  Cough  This is a new problem. The current episode started in the past 7 days. The problem has been gradually worsening. The problem occurs every few minutes. The cough is productive of sputum. Associated symptoms include chills, ear congestion, ear pain, a fever, headaches, myalgias, nasal congestion, postnasal drip, rhinorrhea and a sore throat. Pertinent negatives include no wheezing. The symptoms are aggravated by lying down. He has tried rest and OTC cough suppressant for the symptoms. The treatment provided mild relief.  Fever   Associated symptoms include coughing, ear pain, headaches and a sore throat. Pertinent negatives include no wheezing.      Review of Systems  Constitutional: Positive for chills and fever.  HENT: Positive for ear pain, postnasal drip, rhinorrhea and sore throat.   Respiratory: Positive for cough. Negative for wheezing.   Musculoskeletal: Positive for myalgias.  Neurological: Positive for headaches.  All other systems reviewed and are negative.      Objective:   Physical Exam  Constitutional: He is oriented to person, place, and time. He appears well-developed and well-nourished. No distress.  HENT:  Head: Normocephalic.  Right Ear: External ear normal.  Left Ear: External ear normal.  Nose: Mucosal edema and rhinorrhea present.  Mouth/Throat: Posterior oropharyngeal erythema present.  Eyes: Pupils are equal, round, and reactive to light. Right eye exhibits no discharge. Left eye exhibits no discharge.  Neck: Normal range of motion. Neck supple. No thyromegaly present.  Cardiovascular: Normal rate, regular rhythm, normal heart sounds and intact distal pulses.   No murmur heard. Pulmonary/Chest: Effort normal and breath sounds normal. No respiratory distress. He has no wheezes.  Dry intermittent nonproductive cough  Abdominal: Soft. Bowel sounds are  normal. He exhibits no distension. There is no tenderness.  Musculoskeletal: Normal range of motion. He exhibits no edema or tenderness.  Neurological: He is alert and oriented to person, place, and time.  Skin: Skin is warm and dry. No rash noted. No erythema.  Psychiatric: He has a normal mood and affect. His behavior is normal. Judgment and thought content normal.  Vitals reviewed.   BP 114/72   Pulse 78   Temp 98.5 F (36.9 C) (Oral)   Ht 5\' 7"  (1.702 m)   Wt 173 lb 3.2 oz (78.6 kg)   BMI 27.13 kg/m        Assessment & Plan:  1. Acute bronchitis, unspecified organism -- Take meds as prescribed - Use a cool mist humidifier  -Use saline nose sprays frequently -Saline irrigations of the nose can be very helpful if done frequently.  * 4X daily for 1 week*  * Use of a nettie pot can be helpful with this. Follow directions with this* -Force fluids -For any cough or congestion  Use plain Mucinex- regular strength or max strength is fine   * Children- consult with Pharmacist for dosing -For fever or aces or pains- take tylenol or ibuprofen appropriate for age and weight.  * for fevers greater than 101 orally you may alternate ibuprofen and tylenol every  3 hours. -Throat lozenges if help -New toothbrush in 3 days - azithromycin (ZITHROMAX) 250 MG tablet; Take 500 mg once, then 250 mg for four days  Dispense: 6 tablet; Refill: 0   Jannifer Rodneyhristy Sherae Santino, FNP

## 2017-08-20 ENCOUNTER — Ambulatory Visit (INDEPENDENT_AMBULATORY_CARE_PROVIDER_SITE_OTHER): Payer: BLUE CROSS/BLUE SHIELD | Admitting: Physician Assistant

## 2017-08-20 VITALS — BP 111/73 | HR 66 | Temp 98.0°F | Ht 67.0 in | Wt 172.0 lb

## 2017-08-20 DIAGNOSIS — J0110 Acute frontal sinusitis, unspecified: Secondary | ICD-10-CM

## 2017-08-20 DIAGNOSIS — R059 Cough, unspecified: Secondary | ICD-10-CM

## 2017-08-20 DIAGNOSIS — R05 Cough: Secondary | ICD-10-CM

## 2017-08-20 MED ORDER — HYDROCODONE-HOMATROPINE 5-1.5 MG/5ML PO SYRP: 5.0000 mL | ORAL_SOLUTION | Freq: Four times a day (QID) | ORAL | 0 refills | Status: DC | PRN

## 2017-08-20 MED ORDER — AZITHROMYCIN 250 MG PO TABS: ORAL_TABLET | ORAL | 0 refills | Status: DC

## 2017-08-20 NOTE — Progress Notes (Signed)
There were no vitals taken for this visit.   Subjective:    Patient ID: Christopher Haas, male    DOB: Mar 15, 1961, 56 y.o.   MRN: [id number]  HPI: Christopher Haas is a 56 y.o. male presenting on 08/20/2017 for Cough (non productive, last 3 days, no known fever, feels hot) and Sore Throat  This patient has had many days of sinus headache and postnasal drainage. There is copious drainage at times. Denies any fever at this time. There has been a history of sinus infections in the past.  No history of sinus surgery. There is cough at night. It has become more prevalent in recent days.  Relevant past medical, surgical, family and social history reviewed and updated as indicated. Allergies and medications reviewed and updated.  Past Medical History:  Diagnosis Date  . Ulcerative colitis (HCC)     History reviewed. No pertinent surgical history.  Review of Systems  Constitutional: Positive for fatigue. Negative for appetite change.  HENT: Positive for sinus pressure and sore throat.   Eyes: Negative.  Negative for pain and visual disturbance.  Respiratory: Positive for shortness of breath and wheezing. Negative for cough and chest tightness.   Cardiovascular: Negative.  Negative for chest pain, palpitations and leg swelling.  Gastrointestinal: Negative.  Negative for abdominal pain, diarrhea, nausea and vomiting.  Endocrine: Negative.   Genitourinary: Negative.   Musculoskeletal: Positive for back pain and myalgias.  Skin: Negative.  Negative for color change and rash.  Neurological: Positive for headaches. Negative for weakness and numbness.  Psychiatric/Behavioral: Negative.     Allergies as of 08/20/2017   No Known Allergies     Medication List       Accurate as of 08/20/17  8:32 AM. Always use your most recent med list.          azithromycin 250 MG tablet Commonly known as:  ZITHROMAX Z-PAK As directed   folic acid 1 MG tablet Commonly known as:  FOLVITE Take 1 mg  by mouth daily.   HYDROcodone-homatropine 5-1.5 MG/5ML syrup Commonly known as:  HYCODAN Take 5 mLs by mouth every 6 (six) hours as needed for cough.   methotrexate 2.5 MG tablet Commonly known as:  RHEUMATREX Take by mouth. 10 tablets once a week   REMICADE 100 MG injection Generic drug:  inFLIXimab Inject into the vein. Every 2 months            Discharge Care Instructions        Start     Ordered   08/20/17 0000  azithromycin (ZITHROMAX Z-PAK) 250 MG tablet    Question:  Supervising Provider  Answer:  Elenora Gamma   08/20/17 0832   08/20/17 0000  HYDROcodone-homatropine (HYCODAN) 5-1.5 MG/5ML syrup  Every 6 hours PRN    Question:  Supervising Provider  Answer:  Elenora Gamma   08/20/17 0832         Objective:    There were no vitals taken for this visit.  No Known Allergies  Physical Exam  Constitutional: He is oriented to person, place, and time. He appears well-developed and well-nourished.  HENT:  Head: Normocephalic and atraumatic.  Right Ear: Tympanic membrane and external ear normal. No middle ear effusion.  Left Ear: Tympanic membrane and external ear normal.  No middle ear effusion.  Nose: Mucosal edema and rhinorrhea present. Right sinus exhibits no maxillary sinus tenderness. Left sinus exhibits no maxillary sinus tenderness.  Mouth/Throat: Uvula is midline. Posterior oropharyngeal erythema present.  Eyes: Pupils are equal, round, and reactive to light. Conjunctivae and EOM are normal. Right eye exhibits no discharge. Left eye exhibits no discharge.  Neck: Normal range of motion.  Cardiovascular: Normal rate, regular rhythm and normal heart sounds.   Pulmonary/Chest: Effort normal and breath sounds normal. No respiratory distress. He has no wheezes.  Abdominal: Soft.  Lymphadenopathy:    He has no cervical adenopathy.  Neurological: He is alert and oriented to person, place, and time.  Skin: Skin is warm and dry.  Psychiatric: He has a  normal mood and affect.        Assessment & Plan:   1. Acute non-recurrent frontal sinusitis - azithromycin (ZITHROMAX Z-PAK) 250 MG tablet; As directed  Dispense: 6 tablet; Refill: 0  2. Cough - HYDROcodone-homatropine (HYCODAN) 5-1.5 MG/5ML syrup; Take 5 mLs by mouth every 6 (six) hours as needed for cough.  Dispense: 120 mL; Refill: 0    Current Outpatient Prescriptions:  .  folic acid (FOLVITE) 1 MG tablet, Take 1 mg by mouth daily., Disp: , Rfl:  .  inFLIXimab (REMICADE) 100 MG injection, Inject into the vein. Every 2 months, Disp: , Rfl:  .  methotrexate (RHEUMATREX) 2.5 MG tablet, Take by mouth. 10 tablets once a week, Disp: , Rfl:  .  azithromycin (ZITHROMAX Z-PAK) 250 MG tablet, As directed, Disp: 6 tablet, Rfl: 0 .  HYDROcodone-homatropine (HYCODAN) 5-1.5 MG/5ML syrup, Take 5 mLs by mouth every 6 (six) hours as needed for cough., Disp: 120 mL, Rfl: 0 Continue all other maintenance medications as listed above.  Follow up plan: No Follow-up on file.  Educational handout given for survey  Remus Loffler PA-C [facility] [address]  [place], [place] [address] [phone]   08/20/2017, 8:32 AM

## 2017-08-20 NOTE — Patient Instructions (Signed)
In a few days you may receive a survey in the mail or online from [employer] regarding your visit with us today. Please take a moment to fill this out. Your feedback is very important to our whole office. It can help us better understand your needs as well as improve your experience and satisfaction. Thank you for taking your time to complete it. We care about you.  Mykell Rawl, PA-C  

## 2017-09-21 ENCOUNTER — Ambulatory Visit (INDEPENDENT_AMBULATORY_CARE_PROVIDER_SITE_OTHER): Payer: BLUE CROSS/BLUE SHIELD

## 2017-09-21 DIAGNOSIS — Z23 Encounter for immunization: Secondary | ICD-10-CM | POA: Diagnosis not present

## 2018-03-16 DIAGNOSIS — J301 Allergic rhinitis due to pollen: Secondary | ICD-10-CM | POA: Insufficient documentation

## 2018-03-16 DIAGNOSIS — R12 Heartburn: Secondary | ICD-10-CM | POA: Insufficient documentation

## 2018-07-17 ENCOUNTER — Ambulatory Visit: Payer: BLUE CROSS/BLUE SHIELD | Admitting: Family

## 2018-07-17 ENCOUNTER — Encounter: Payer: Self-pay | Admitting: Family

## 2018-07-17 VITALS — BP 131/75 | HR 74 | Temp 97.3°F | Ht 67.0 in | Wt 179.0 lb

## 2018-07-17 DIAGNOSIS — J0100 Acute maxillary sinusitis, unspecified: Secondary | ICD-10-CM | POA: Diagnosis not present

## 2018-07-17 DIAGNOSIS — B353 Tinea pedis: Secondary | ICD-10-CM | POA: Diagnosis not present

## 2018-07-17 MED ORDER — AMOXICILLIN-POT CLAVULANATE 875-125 MG PO TABS: 1.0000 | ORAL_TABLET | Freq: Two times a day (BID) | ORAL | 0 refills | Status: DC

## 2018-07-17 MED ORDER — CLOTRIMAZOLE-BETAMETHASONE 1-0.05 % EX CREA: 1.0000 "application " | TOPICAL_CREAM | Freq: Two times a day (BID) | CUTANEOUS | 0 refills | Status: DC

## 2018-07-17 MED ORDER — FLUTICASONE PROPIONATE 50 MCG/ACT NA SUSP: 2.0000 | Freq: Every day | NASAL | 6 refills | Status: DC

## 2018-07-17 NOTE — Patient Instructions (Signed)
Pie de atleta (Athlete's Foot) El pie de atleta (tinea pedis) es una infeccin por hongos en la piel de los pies. Generalmente se produce en la piel que se encuentra entre los dedos o debajo de ellos. Tambin puede aparecer en la planta de los pies. La infeccin puede transmitirse de Neomia Dearuna persona a otra (es contagiosa). CAUSAS La causa del pie de atleta es un hongo. Este hongo crece en los lugares clidos y hmedos. La Harley-Davidsonmayora de las personas se contagian el pie de atleta al compartir duchas, toallas y pisos mojados con una persona infectada. La falta de higiene en los pies o no cambiarse los calcetines con frecuencia puede contribuir a la aparicin del pie de atleta. FACTORES DE RIESGO Es ms probable que esta afeccin se manifieste en:  Hombres.  Las personas cuyo sistema de defensa del organismo (sistema inmunitario) es dbil.  Los diabticos.  Las personas que usan duchas pblicas, por ejemplo, en un gimnasio.  Las personas que usan zapatos reforzados, Lubrizol Corporationcomo los de uso industrial o Civil engineer, contractingmilitar.  Durante las Rohm and Haasestaciones en las que el clima es clido y hmedo. SNTOMAS Los sntomas de esta afeccin incluyen lo siguiente:  Zonas que pican entre los dedos o en las plantas de los pies.  Zonas blancas speras o escamosas entre los dedos o en las plantas de los pies.  Ampollas pequeas que pican mucho entre los dedos o en las plantas de los pies.  Pequeos cortes en la piel. Estos cortes pueden infectarse.  Uas de los pies engrosadas o pigmentadas. DIAGNSTICO Esta afeccin se diagnostica mediante la historia clnica y un examen fsico. El mdico tambin puede tomarle una Vassmuestra de piel o de una ua del pie para examinarla. TRATAMIENTO El tratamiento de esta afeccin incluye antimicticos. Estos medicamentos se pueden aplicar como polvos, ungentos o cremas. En los casos graves, se pueden Building services engineeradministrar antimicticos por va oral. INSTRUCCIONES PARA EL CUIDADO EN EL HOGAR  Aplquese o tome  los medicamentos de venta libre y los recetados solamente como se lo haya indicado el mdico.  OceanographerConcurra a todas las visitas de control como se lo haya indicado el mdico. Esto es importante.  No se rasque los pies.  Mantenga los pies secos: ? Use calcetines de algodn o lana. Cmbiese los calcetines CarMaxtodos los das o si se mojan. ? Use un tipo de calzado que permita que el aire Everettscircule, como sandalias o zapatillas de lona.  Lvese y squese los pies: ? SunTrustodos los das o como se lo haya indicado el mdico. ? Despus de Lear Corporationhacer actividad fsica. ? Sin olvidar la zona The Krogerentre los dedos.  No comparta con Starwood Hotelsotras personas toallas, alicates para las uas u otros objetos de uso personal que tengan contacto con sus pies.  Si tiene diabetes, mantenga bajo control el nivel de Bankerazcar en la sangre. PREVENCIN  No comparta toallas.  Use sandalias cuando tenga que pisar zonas hmedas, como vestuarios y duchas compartidas.  Mantenga los pies secos: ? Use calcetines de algodn o lana. Cmbiese los calcetines CarMaxtodos los das o si se mojan. ? Use un tipo de calzado que permita que el aire Greenfieldcircule, como sandalias o zapatillas de lona.  Lvese y squese los pies despus de hacer actividad fsica. Preste atencin a la zona The Krogerentre los dedos de los pies. SOLICITE ATENCIN MDICA SI:  Lance Mussiene fiebre.  Aumenta la hinchazn, el dolor, el calor o el enrojecimiento en el pie.  No mejora con el tratamiento.  Los sntomas empeoran.  Aparecen nuevos sntomas.  Esta informacin no tiene Theme park manager el consejo del mdico. Asegrese de hacerle al mdico cualquier pregunta que tenga. Document Released: 11/08/2005 Document Revised: 03/01/2016 Document Reviewed: 05/12/2015 Elsevier Interactive Patient Education  2018 [employer]. Sinusitis, en adultos Sinusitis, Adult La sinusitis es la inflamacin y Chief Technology Officer en los senos paranasales. Los senos paranasales son espacios vacos en los huesos alrededor del rostro.  Los senos paranasales se encuentran en estos lugares:  Alrededor de los ojos.  En la mitad de la frente.  Detrs de Architectural technologist.  En los pmulos.  Los senos y las fosas nasales estn cubiertos de un lquido fibroso (mucosidad). Normalmente, la mucosidad drena a travs de los senos. Cuando los tejidos nasales se inflaman o hinchan, la mucosidad puede quedar atrapada o bloqueada, por lo que el aire no puede fluir por los senos paranasales. Esto fomenta la proliferacin de bacterias, virus y hongos, lo que produce infecciones. La sinusitis puede desarrollarse rpidamente y durar entre 7 y 10das (aguda) o ms de 12das (crnica). A menudo, esta afeccin surge despus de un resfriado. Cules son las causas? Esta afeccin es causada por cualquier sustancia que inflame los senos o evite que la mucosidad drene, por ejemplo:  Alergias.  Asma.  Infecciones virales o bacterianas.  Huesos con forma Fiserv las fosas nasales.  Crecimientos nasales que contienen mucosidad (plipos nasales).  Aberturas sinusales estrechas.  Agentes contaminantes, como sustancias qumicas o irritantes presentes en el aire.  Un cuerpo extrao atorado [employer]ent Financial.  Infecciones por hongos. Esto es raro.  Qu incrementa el riesgo? Los siguientes factores pueden hacer que usted sea ms propenso a tener esta enfermedad:  Archivist o asma.  Haber tenido una infeccin reciente en las vas respiratorias superiores o un resfriado.  Tener deformidades estructurales u obstrucciones en la nariz o los senos.  Tener un sistema inmunitario dbil.  Nadar o bucear mucho.  Abusar de los [employer].  Fumar.  Cules son los signos o los sntomas? Los principales sntomas de esta afeccin son dolor y sensacin de presin alrededor de los senos afectados. Otros sntomas pueden ser los siguientes:  Dolor en los dientes superiores.  Dolor de odos.  Dolor de [place].  Mal  aliento.  Disminucin del sentido del olfato y del gusto.  Tos que empeora por la noche.  Fatiga.  Grant Ruts.  Drenaje de mucosidad espesa que sale de la Clinical cytogeneticist. Generalmente, es de color verde y puede contener pus (purulento).  Nariz tapada o congestin nasal.  Goteo posnasal. Esto ocurre cuando se acumula mucosidad adicional en la garganta o la parte de atrs de la Clinical cytogeneticist.  Hinchazn y calor en los senos paranasales afectados.  Dolor de Advertising copywriter.  Sensibilidad a Statistician.  Cmo se diagnostica? Esta enfermedad se diagnostica en funcin de los sntomas, los antecedentes mdicos y un examen fsico. Para descubrir si su afeccin es aguda o crnica, el mdico podra hacer lo siguiente:  Revisar su nariz en busca de plipos nasales.  Palpar los senos paranasales afectados para buscar signos de infeccin.  Observar la parte interna de los senos paranasales con un dispositivo que tiene una luz (endoscopio).  Si el mdico sospecha que usted padece sinusitis crnica, podra indicarle lo siguiente:  Pruebas de alergias.  Una muestra de mucosidad de la nariz (cultivo nasal) para buscar bacterias.  Examen de [place] de mucosidad, para ver si la sinusitis se relaciona con alguna alergia.  Si la sinusitis no responde al tratamiento y dura ms de  8semanas, se le podra pedir Hotel manager (RM) o una exploracin por tomografa computarizada (TC) para examinar los senos paranasales. Estos estudios tambin ayudan a Production assistant, radio gravedad de la infeccin. En contadas ocasiones, se puede ordenar una biopsia de hueso para descartar tipos ms graves de infecciones por hongos en los senos paranasales. Cmo se trata? El tratamiento para la sinusitis depende de la causa y de si la afeccin es [place]. Si lo que causa la sinusitis es un virus, los sntomas desparecern por s solos en el trmino de 10das. Podran recetarle medicamentos para [employer], entre los que  se incluyen los siguientes:  Descongestivos nasales tpicos. Desinflaman las fosas nasales y permiten que la mucosidad drene por los senos paranasales.  Antihistamnicos. Este tipo de medicamento bloquea la inflamacin que ocasionan las [place]. Pueden ayudar a reducir la inflamacin en la nariz y los senos.  Corticoesteroides nasales tpicos. Son aerosoles nasales que reducen la inflamacin e hinchazn en la Darene Lamer y los senos.  Lavados nasales con solucin salina. Estos enjuagues pueden ayudar a eliminar la mucosidad espesa en la nariz.  Si la afeccin es causada por una bacteria, se le recetarn antibiticos. Si es causada por un hongo, se le recetarn antimicticos. Se podra necesitar ciruga para tratar enfermedades preexistentes, como las fosas nasales estrechas. Tambin podra ser necesaria para eliminar plipos. Siga estas indicaciones en su casa: Micron Technology, use o aplquese los medicamentos de venta libre y Building control surveyor como se lo haya indicado el mdico. Estos pueden incluir aerosoles nasales.  Si le recetaron un antibitico, tmelo como se lo haya indicado el mdico. No deje de tomar el antibitico aunque comience a sentirse mejor. Hidrtese y humidifique los ambientes  Beba suficiente agua para Pharmacologist la orina clara o de color amarillo plido. Mantenerse hidratado lo ayudar a Winn-Dixie.  Use un humidificador de vapor fro para mantener la humedad de su hogar por encima del 50%.  Realice inhalaciones de vapor por 10 a , de 3 a 4veces al da o tal como se lo haya indicado el mdico. Puede hacer esto en el bao con el vapor del agua caliente de la ducha.  Limite la exposicin al aire fro o seco. Reposo  Descanse todo lo que pueda.  Duerma con la cabeza levantada (elevada).  Asegrese de dormir lo suficiente cada noche. Instrucciones generales  Aplquese un pao tibio y hmedo en la cara 3 o 4veces al da o como se lo haya  indicado el mdico. Esto ayuda a Optician, dispensing las [place].  Lvese las manos frecuentemente con agua y jabn para reducir la exposicin a virus y otras bacterias. Use desinfectante para manos si no dispone de [place] y [place].  No fume. Evite estar cerca de personas que fuman (fumador pasivo).  Concurra a todas las visitas de [address] se lo haya indicado el mdico. Esto es importante. Comunquese con un mdico si:  Tiene fiebre.  Los sntomas empeoran.  Los sntomas no mejoran en el trmino de 10das. Solicite ayuda de inmediato si:  Tiene un dolor de cabeza intenso.  Tiene vmitos persistentes.  Tiene dolor o hinchazn en la zona del rostro o los ojos.  Tiene problemas de visin.  Se siente confundido.  Tiene el cuello rgido.  Tiene dificultad para respirar. Esta informacin no tiene Theme park manager el consejo del mdico. Asegrese de hacerle al mdico cualquier pregunta que tenga. Document Released: 08/18/2005 Document Revised: 03/22/2017 Document Reviewed: 09/03/2015 Elsevier Interactive Patient Education  2018 Elsevier Inc.  

## 2018-07-17 NOTE — Progress Notes (Signed)
Subjective:    Patient ID: Christopher Haas, male    DOB: 1961-07-05, 57 y.o.   MRN: [id number]  Chief Complaint  Patient presents with  . Nasal Congestion    feels swollen in nose with dryness  . itching rash on foot    Sinusitis  This is a new problem. The current episode started more than 1 month ago. The problem has been gradually worsening since onset. There has been no fever. His pain is at a severity of 5/10. The pain is mild. Associated symptoms include congestion, a hoarse voice, sinus pressure and sneezing. Pertinent negatives include no coughing, ear pain, headaches or sore throat. Past treatments include nothing. The treatment provided no relief.      Review of Systems  HENT: Positive for congestion, hoarse voice, sinus pressure and sneezing. Negative for ear pain and sore throat.   Respiratory: Negative for cough.   Neurological: Negative for headaches.  All other systems reviewed and are negative.      Objective:   Physical Exam  Constitutional: He is oriented to person, place, and time. He appears well-developed and well-nourished. No distress.  HENT:  Head: Normocephalic.  Right Ear: External ear normal.  Left Ear: External ear normal.  Nose: Mucosal edema and rhinorrhea present. Right sinus exhibits maxillary sinus tenderness. Left sinus exhibits maxillary sinus tenderness.  Mouth/Throat: Posterior oropharyngeal edema and posterior oropharyngeal erythema present.  Eyes: Pupils are equal, round, and reactive to light. Right eye exhibits no discharge. Left eye exhibits no discharge.  Neck: Normal range of motion. Neck supple. No thyromegaly present.  Cardiovascular: Normal rate, regular rhythm, normal heart sounds and intact distal pulses.  No murmur heard. Pulmonary/Chest: Effort normal and breath sounds normal. No respiratory distress. He has no wheezes.  Abdominal: Soft. Bowel sounds are normal. He exhibits no distension. There is no tenderness.    Musculoskeletal: Normal range of motion. He exhibits no edema or tenderness.  Neurological: He is alert and oriented to person, place, and time. He has normal reflexes. No cranial nerve deficit.  Skin: Skin is warm and dry. Rash (dorsum of left foot) noted. No erythema.  Psychiatric: He has a normal mood and affect. His behavior is normal. Judgment and thought content normal.  Vitals reviewed.     BP 131/75   Pulse 74   Temp (!) 97.3 F (36.3 C) (Oral)   Ht 5\' 7"  (1.702 m)   Wt 179 lb (81.2 kg)   BMI 28.04 kg/m      Assessment & Plan:  Christopher CommentGuillermo Breon comes in today with chief complaint of Nasal Congestion (feels swollen in nose with dryness) and itching rash on foot   Diagnosis and orders addressed:  1. Acute maxillary sinusitis, recurrence not specified - Take meds as prescribed - Use a cool mist humidifier  -Use saline nose sprays frequently -Force fluids -For any cough or congestion  Use plain Mucinex- regular strength or max strength is fine -For fever or aces or pains- take tylenol or ibuprofen. -Throat lozenges if help -New toothbrush in 3 days - amoxicillin-clavulanate (AUGMENTIN) 875-125 MG tablet; Take 1 tablet by mouth 2 (two) times daily.  Dispense: 14 tablet; Refill: 0 - fluticasone (FLONASE) 50 MCG/ACT nasal spray; Place 2 sprays into both nostrils daily.  Dispense: 16 g; Refill: 6  2. Tinea pedis of left foot Do not scratch Do not walk around bare foot Good hand hygiene  RTO if symptoms worsen or do not improve - clotrimazole-betamethasone (LOTRISONE) cream; Apply 1  application topically 2 (two) times daily.  Dispense: 30 g; Refill: 0  Jannifer Rodney, FNP

## 2018-09-07 ENCOUNTER — Ambulatory Visit (INDEPENDENT_AMBULATORY_CARE_PROVIDER_SITE_OTHER): Payer: Commercial Managed Care - PPO

## 2018-09-07 DIAGNOSIS — Z23 Encounter for immunization: Secondary | ICD-10-CM

## 2018-09-15 DIAGNOSIS — K5180 Other ulcerative colitis without complications: Secondary | ICD-10-CM | POA: Diagnosis not present

## 2018-09-30 ENCOUNTER — Telehealth: Payer: Self-pay | Admitting: Family Medicine

## 2018-09-30 ENCOUNTER — Ambulatory Visit: Payer: Commercial Managed Care - PPO | Admitting: Family Medicine

## 2018-09-30 ENCOUNTER — Other Ambulatory Visit: Payer: Self-pay | Admitting: Family

## 2018-09-30 VITALS — BP 133/73 | HR 77 | Temp 98.8°F | Ht 67.0 in | Wt 174.0 lb

## 2018-09-30 DIAGNOSIS — D849 Immunodeficiency, unspecified: Secondary | ICD-10-CM

## 2018-09-30 DIAGNOSIS — J0110 Acute frontal sinusitis, unspecified: Secondary | ICD-10-CM

## 2018-09-30 DIAGNOSIS — B353 Tinea pedis: Secondary | ICD-10-CM

## 2018-09-30 DIAGNOSIS — R61 Generalized hyperhidrosis: Secondary | ICD-10-CM

## 2018-09-30 DIAGNOSIS — D899 Disorder involving the immune mechanism, unspecified: Secondary | ICD-10-CM | POA: Diagnosis not present

## 2018-09-30 MED ORDER — AMOXICILLIN-POT CLAVULANATE 875-125 MG PO TABS: 1.0000 | ORAL_TABLET | Freq: Two times a day (BID) | ORAL | 0 refills | Status: DC

## 2018-09-30 MED ORDER — MUPIROCIN 2 % EX OINT: 1.0000 "application " | TOPICAL_OINTMENT | Freq: Two times a day (BID) | CUTANEOUS | 1 refills | Status: DC

## 2018-09-30 MED ORDER — CLOTRIMAZOLE-BETAMETHASONE 1-0.05 % EX CREA: 1.0000 "application " | TOPICAL_CREAM | Freq: Two times a day (BID) | CUTANEOUS | 2 refills | Status: DC

## 2018-09-30 NOTE — Telephone Encounter (Signed)
Dr. Louanne Skye sent in other 2 prescriptions. Patient notified.

## 2018-09-30 NOTE — Addendum Note (Signed)
Addended by: Arville Care on: 09/30/2018 09:57 AM   Modules accepted: Orders

## 2018-09-30 NOTE — Progress Notes (Signed)
BP 133/73   Pulse 77   Temp 98.8 F (37.1 C)   Ht 5\' 7"  (1.702 m)   Wt 174 lb (78.9 kg)   BMI 27.25 kg/m    Subjective:    Patient ID: Christopher Haas, male    DOB: 1961/03/27, 57 y.o.   MRN: [id number]  HPI: Christopher Haas is a 57 y.o. male presenting on 09/30/2018 for Night Sweats   HPI Night sweats Patient comes in complaining of night sweats that have been going on over the past 2 weeks.  Patient is on immunosuppressants including Remicade and methotrexate for his ulcerative colitis and put some immunosuppressive situation which makes any symptoms like this very concerning.  He denies any cough or congestion or wheezing.  He does complain of sinus pressure and drainage and sinus headaches occasionally.  He says he gets this every now and then but this time is been happening consistently.  He denies any urinary symptoms or bowel symptoms or any problems with his ulcerative colitis currently.  Relevant past medical, surgical, family and social history reviewed and updated as indicated. Interim medical history since our last visit reviewed. Allergies and medications reviewed and updated.  Review of Systems  Constitutional: Negative for chills and fever (He denies any true fever but has had night sweats).  HENT: Positive for congestion, sinus pressure and sinus pain. Negative for sneezing and sore throat.   Eyes: Negative for visual disturbance.  Respiratory: Negative for shortness of breath and wheezing.   Cardiovascular: Negative for chest pain and leg swelling.  Gastrointestinal: Negative for abdominal pain, diarrhea, nausea and vomiting.  Genitourinary: Negative for difficulty urinating, dysuria, frequency and urgency.  Musculoskeletal: Negative for back pain and gait problem.  Skin: Negative for rash.  Neurological: Positive for headaches.  All other systems reviewed and are negative.   Per HPI unless specifically indicated above   Allergies as of 09/30/2018   No  Known Allergies     Medication List        Accurate as of 09/30/18  8:41 AM. Always use your most recent med list.          amoxicillin-clavulanate 875-125 MG tablet Commonly known as:  AUGMENTIN Take 1 tablet by mouth 2 (two) times daily.   clotrimazole-betamethasone cream Commonly known as:  LOTRISONE Apply 1 application topically 2 (two) times daily.   fluticasone 50 MCG/ACT nasal spray Commonly known as:  FLONASE Place 2 sprays into both nostrils daily.   folic acid 1 MG tablet Commonly known as:  FOLVITE Take 1 mg by mouth daily.   methotrexate 2.5 MG tablet Commonly known as:  RHEUMATREX Take by mouth. 10 tablets once a week   REMICADE 100 MG injection Generic drug:  inFLIXimab Inject into the vein. Every 2 months          Objective:    BP 133/73   Pulse 77   Temp 98.8 F (37.1 C)   Ht 5\' 7"  (1.702 m)   Wt 174 lb (78.9 kg)   BMI 27.25 kg/m   Wt Readings from Last 3 Encounters:  09/30/18 174 lb (78.9 kg)  07/17/18 179 lb (81.2 kg)  08/20/17 172 lb (78 kg)    Physical Exam  Constitutional: He is oriented to person, place, and time. He appears well-developed and well-nourished. No distress.  HENT:  Right Ear: Tympanic membrane, external ear and ear canal normal.  Left Ear: Tympanic membrane, external ear and ear canal normal.  Nose: Right sinus exhibits frontal  sinus tenderness. Left sinus exhibits frontal sinus tenderness.  Mouth/Throat: Mucous membranes are normal. Posterior oropharyngeal erythema present. No oropharyngeal exudate or posterior oropharyngeal edema.  Eyes: Conjunctivae are normal. No scleral icterus.  Neck: Neck supple. No thyromegaly present.  Cardiovascular: Normal rate, regular rhythm, normal heart sounds and intact distal pulses.  No murmur heard. Pulmonary/Chest: Effort normal and breath sounds normal. No respiratory distress. He has no wheezes.  Abdominal: Soft. Bowel sounds are normal. He exhibits no distension and no mass.  There is no tenderness. There is no guarding.  Musculoskeletal: Normal range of motion. He exhibits no edema.  Lymphadenopathy:    He has no cervical adenopathy.  Neurological: He is alert and oriented to person, place, and time. Coordination normal.  Skin: Skin is warm and dry. No rash noted. He is not diaphoretic.  Psychiatric: He has a normal mood and affect. His behavior is normal.  Nursing note and vitals reviewed.       Assessment & Plan:   Problem List Items Addressed This Visit    None    Visit Diagnoses    Acute non-recurrent frontal sinusitis    -  Primary   Relevant Medications   amoxicillin-clavulanate (AUGMENTIN) 875-125 MG tablet   Other Relevant Orders   CBC with Differential/Platelet   Urinalysis   Urine Culture   Unexplained night sweats       She is immunocompromise, will check urine and CBC, if not improved by Monday then will get x-ray on Monday   Relevant Medications   amoxicillin-clavulanate (AUGMENTIN) 875-125 MG tablet   Other Relevant Orders   CBC with Differential/Platelet   Urinalysis   Urine Culture   Immunosuppressed status (HCC)         We will check urine and CBC and if not improved by Monday with the Augmentin and will have him come back for an x-ray.  Follow up plan: Return if symptoms worsen or fail to improve.  Counseling provided for all of the vaccine components Orders Placed This Encounter  Procedures  . Urine Culture  . CBC with Differential/Platelet  . Urinalysis    Arville Care, MD [facility] 09/30/2018, 8:41 AM

## 2018-10-01 LAB — CBC WITH DIFFERENTIAL/PLATELET
Basophils Absolute: 0.1 10*3/uL (ref 0.0–0.2)
Basos: 1 %
EOS (ABSOLUTE): 0.4 10*3/uL (ref 0.0–0.4)
EOS: 6 %
HEMATOCRIT: 46.7 % (ref 37.5–51.0)
HEMOGLOBIN: 16.3 g/dL (ref 13.0–17.7)
IMMATURE GRANS (ABS): 0 10*3/uL (ref 0.0–0.1)
Immature Granulocytes: 0 %
LYMPHS ABS: 3.3 10*3/uL — AB (ref 0.7–3.1)
LYMPHS: 48 %
MCH: 31.2 pg (ref 26.6–33.0)
MCHC: 34.9 g/dL (ref 31.5–35.7)
MCV: 89 fL (ref 79–97)
MONOCYTES: 6 %
Monocytes Absolute: 0.4 10*3/uL (ref 0.1–0.9)
Neutrophils Absolute: 2.8 10*3/uL (ref 1.4–7.0)
Neutrophils: 39 %
Platelets: 238 10*3/uL (ref 150–450)
RBC: 5.23 x10E6/uL (ref 4.14–5.80)
RDW: 13.4 % (ref 12.3–15.4)
WBC: 7 10*3/uL (ref 3.4–10.8)

## 2018-10-01 LAB — URINE CULTURE: ORGANISM ID, BACTERIA: NO GROWTH

## 2018-10-02 LAB — URINALYSIS
Bilirubin, UA: NEGATIVE
GLUCOSE, UA: NEGATIVE
KETONES UA: NEGATIVE
Leukocytes, UA: NEGATIVE
NITRITE UA: NEGATIVE
Protein, UA: NEGATIVE
RBC, UA: NEGATIVE
Specific Gravity, UA: 1.02 (ref 1.005–1.030)
Urobilinogen, Ur: 0.2 mg/dL (ref 0.2–1.0)
pH, UA: 7.5 (ref 5.0–7.5)

## 2018-10-16 ENCOUNTER — Ambulatory Visit: Payer: Commercial Managed Care - PPO

## 2018-10-18 ENCOUNTER — Encounter: Payer: Self-pay | Admitting: Family Medicine

## 2018-10-18 ENCOUNTER — Ambulatory Visit: Payer: Commercial Managed Care - PPO | Admitting: Family Medicine

## 2018-10-18 ENCOUNTER — Ambulatory Visit (INDEPENDENT_AMBULATORY_CARE_PROVIDER_SITE_OTHER): Payer: Commercial Managed Care - PPO

## 2018-10-18 VITALS — BP 123/76 | HR 71 | Temp 98.3°F | Ht 67.0 in | Wt 177.6 lb

## 2018-10-18 DIAGNOSIS — R61 Generalized hyperhidrosis: Secondary | ICD-10-CM | POA: Diagnosis not present

## 2018-10-18 DIAGNOSIS — D899 Disorder involving the immune mechanism, unspecified: Secondary | ICD-10-CM

## 2018-10-18 DIAGNOSIS — D849 Immunodeficiency, unspecified: Secondary | ICD-10-CM

## 2018-10-18 NOTE — Progress Notes (Signed)
BP 123/76   Pulse 71   Temp 98.3 F (36.8 C) (Oral)   Ht 5\' 7"  (1.702 m)   Wt 177 lb 9.6 oz (80.6 kg)   SpO2 98%   BMI 27.82 kg/m    Subjective:    Patient ID: Christopher Haas, male    DOB: Mar 28, 1961, 57 y.o.   MRN: [id number]  HPI: Christopher Haas is a 57 y.o. male presenting on 10/18/2018 for night sweating (x 3 weeks) and Cough (x 5 days but states he is feeling better today)   HPI Patient is coming in today with continued cough and night sweats.  His cough was happening for about 5 days but has resolved finally over the past couple days but he continues to have night sweats every night over the past 3 weeks.  He has not taken his temperature when it happens he does not know if you have any true fever.  Last time he was here we did do an antibiotic and he said he did not really get much improvement from it.  Patient denies any wheezing or cough currently.  He denies any sinus pressure.  He does have a little pressure in his left ear but he says that just started last night.  He denies any urinary or bowel issues.  Relevant past medical, surgical, family and social history reviewed and updated as indicated. Interim medical history since our last visit reviewed. Allergies and medications reviewed and updated.  Review of Systems  Constitutional: Positive for fever. Negative for chills.  HENT: Negative for congestion, sinus pressure, sinus pain, sneezing and sore throat.   Eyes: Negative for discharge.  Respiratory: Positive for cough. Negative for shortness of breath and wheezing.   Cardiovascular: Negative for chest pain and leg swelling.  Musculoskeletal: Negative for back pain and gait problem.  Skin: Negative for rash.  All other systems reviewed and are negative.   Per HPI unless specifically indicated above   Allergies as of 10/18/2018   No Known Allergies     Medication List        Accurate as of 10/18/18  4:46 PM. Always use your most recent med list.          clotrimazole-betamethasone cream Commonly known as:  LOTRISONE Apply 1 application topically 2 (two) times daily.   fluticasone 50 MCG/ACT nasal spray Commonly known as:  FLONASE Place 2 sprays into both nostrils daily.   folic acid 1 MG tablet Commonly known as:  FOLVITE Take 1 mg by mouth daily.   methotrexate 2.5 MG tablet Commonly known as:  RHEUMATREX Take by mouth. 10 tablets once a week   mupirocin ointment 2 % Commonly known as:  BACTROBAN Place 1 application into the nose 2 (two) times daily.   REMICADE 100 MG injection Generic drug:  inFLIXimab Inject into the vein. Every 2 months          Objective:    BP 123/76   Pulse 71   Temp 98.3 F (36.8 C) (Oral)   Ht 5\' 7"  (1.702 m)   Wt 177 lb 9.6 oz (80.6 kg)   SpO2 98%   BMI 27.82 kg/m   Wt Readings from Last 3 Encounters:  10/18/18 177 lb 9.6 oz (80.6 kg)  09/30/18 174 lb (78.9 kg)  07/17/18 179 lb (81.2 kg)    Physical Exam  Constitutional: He is oriented to person, place, and time. He appears well-developed and well-nourished. No distress.  HENT:  Right Ear: Tympanic membrane, external  ear and ear canal normal.  Left Ear: Tympanic membrane, external ear and ear canal normal.  Nose: Mucosal edema and rhinorrhea present. No sinus tenderness. No epistaxis. Right sinus exhibits maxillary sinus tenderness. Right sinus exhibits no frontal sinus tenderness. Left sinus exhibits maxillary sinus tenderness. Left sinus exhibits no frontal sinus tenderness.  Mouth/Throat: Uvula is midline and mucous membranes are normal. Posterior oropharyngeal edema and posterior oropharyngeal erythema present. No oropharyngeal exudate or tonsillar abscesses.  Eyes: Conjunctivae are normal. No scleral icterus.  Neck: Neck supple. No thyromegaly present.  Cardiovascular: Normal rate, regular rhythm, normal heart sounds and intact distal pulses.  No murmur heard. Pulmonary/Chest: Effort normal and breath sounds normal. No  respiratory distress. He has no wheezes. He has no rales.  Musculoskeletal: Normal range of motion. He exhibits no edema.  Lymphadenopathy:    He has no cervical adenopathy.  Neurological: He is alert and oriented to person, place, and time. Coordination normal.  Skin: Skin is warm and dry. No rash noted. He is not diaphoretic.  Psychiatric: He has a normal mood and affect. His behavior is normal.  Nursing note and vitals reviewed.   Chest x-ray: Hypervascularity, await final read from radiologist    Assessment & Plan:   Problem List Items Addressed This Visit    None    Visit Diagnoses    Unexplained night sweats    -  Primary   Relevant Orders   DG Chest 2 View   Immunosuppressed status (HCC)       Relevant Orders   DG Chest 2 View       Follow up plan: Return if symptoms worsen or fail to improve.  Counseling provided for all of the vaccine components Orders Placed This Encounter  Procedures  . DG Chest 2 View    Arville CareJoshua Lidya Mccalister, MD [facility] 10/18/2018, 4:46 PM

## 2018-11-13 DIAGNOSIS — K5180 Other ulcerative colitis without complications: Secondary | ICD-10-CM | POA: Diagnosis not present

## 2018-11-20 ENCOUNTER — Encounter: Payer: Self-pay | Admitting: Family Medicine

## 2018-11-20 ENCOUNTER — Ambulatory Visit: Payer: Commercial Managed Care - PPO | Admitting: Family Medicine

## 2018-11-20 VITALS — BP 119/77 | HR 76 | Temp 98.4°F | Ht 67.0 in | Wt 175.0 lb

## 2018-11-20 DIAGNOSIS — J40 Bronchitis, not specified as acute or chronic: Secondary | ICD-10-CM

## 2018-11-20 DIAGNOSIS — J329 Chronic sinusitis, unspecified: Secondary | ICD-10-CM

## 2018-11-20 DIAGNOSIS — J0100 Acute maxillary sinusitis, unspecified: Secondary | ICD-10-CM | POA: Diagnosis not present

## 2018-11-20 MED ORDER — AMOXICILLIN-POT CLAVULANATE 875-125 MG PO TABS: 1.0000 | ORAL_TABLET | Freq: Two times a day (BID) | ORAL | 0 refills | Status: DC

## 2018-11-20 MED ORDER — GUAIFENESIN-CODEINE 100-10 MG/5ML PO SYRP: 5.0000 mL | ORAL_SOLUTION | ORAL | 0 refills | Status: DC | PRN

## 2018-11-20 NOTE — Progress Notes (Signed)
Chief Complaint  Patient presents with  . Sinus Problem  . cough and chest congestion    HPI  Patient presents today for Symptoms include congestion, facial pain, nasal congestion, non productive cough, post nasal drip and sinus pressure. There is no fever, chills, or sweats. Onset of symptoms was a few days ago, gradually worsening since that time.    PMH: Smoking status noted ROS: Per HPI  Objective: BP 119/77   Pulse 76   Temp 98.4 F (36.9 C) (Oral)   Ht 5\' 7"  (1.702 m)   Wt 175 lb (79.4 kg)   BMI 27.41 kg/m  Gen: NAD, alert, cooperative with exam HEENT: NCAT, EOMI, PERRL CV: RRR, good S1/S2, no murmur. Max tenderness Resp: CTABL, no wheezes, non-labored Abd: SNTND, BS present, no guarding or organomegaly Ext: No edema, warm Neuro: Alert and oriented, No gross deficits  Assessment and plan:  1. Acute maxillary sinusitis, recurrence not specified   2. Sinobronchitis     Meds ordered this encounter  Medications  . amoxicillin-clavulanate (AUGMENTIN) 875-125 MG tablet    Sig: Take 1 tablet by mouth 2 (two) times daily. Take all of this medication    Dispense:  20 tablet    Refill:  0  . guaiFENesin-codeine (CHERATUSSIN AC) 100-10 MG/5ML syrup    Sig: Take 5 mLs by mouth every 4 (four) hours as needed for cough.    Dispense:  180 mL    Refill:  0    No orders of the defined types were placed in this encounter.   Follow up as needed.  Mechele ClaudeWarren Jahnya Trindade, MD

## 2018-12-26 ENCOUNTER — Ambulatory Visit: Payer: Commercial Managed Care - PPO | Admitting: Family Medicine

## 2018-12-30 ENCOUNTER — Ambulatory Visit: Payer: Commercial Managed Care - PPO | Admitting: Family

## 2018-12-30 ENCOUNTER — Encounter: Payer: Self-pay | Admitting: Family

## 2018-12-30 VITALS — BP 114/78 | HR 74 | Temp 98.4°F | Ht 67.0 in | Wt 176.4 lb

## 2018-12-30 DIAGNOSIS — J208 Acute bronchitis due to other specified organisms: Secondary | ICD-10-CM

## 2018-12-30 DIAGNOSIS — B9689 Other specified bacterial agents as the cause of diseases classified elsewhere: Secondary | ICD-10-CM | POA: Diagnosis not present

## 2018-12-30 MED ORDER — DOXYCYCLINE HYCLATE 100 MG PO TABS: 100.0000 mg | ORAL_TABLET | Freq: Two times a day (BID) | ORAL | 0 refills | Status: DC

## 2018-12-30 MED ORDER — PREDNISONE 10 MG (21) PO TBPK: ORAL_TABLET | ORAL | 0 refills | Status: DC

## 2018-12-30 MED ORDER — CETIRIZINE HCL 10 MG PO TABS: 10.0000 mg | ORAL_TABLET | Freq: Every day | ORAL | 11 refills | Status: DC

## 2018-12-30 NOTE — Progress Notes (Signed)
Subjective:    Patient ID: Christopher Haas, male    DOB: [birth year], 58 y.o.   MRN: [id number]  Chief Complaint  Patient presents with  . Cough    Cough  This is a new problem. The current episode started 1 to 4 weeks ago. The problem has been waxing and waning. The problem occurs every few minutes. The cough is productive of sputum. Associated symptoms include nasal congestion and wheezing. Pertinent negatives include no chills, ear congestion, ear pain, fever, headaches, sore throat or shortness of breath. The symptoms are aggravated by lying down. He has tried rest and OTC cough suppressant for the symptoms. The treatment provided mild relief. His past medical history is significant for environmental allergies. There is no history of COPD.      Review of Systems  Constitutional: Negative for chills and fever.  HENT: Negative for ear pain and sore throat.   Respiratory: Positive for cough and wheezing. Negative for shortness of breath.   Allergic/Immunologic: Positive for environmental allergies.  Neurological: Negative for headaches.  All other systems reviewed and are negative.      Objective:   Physical Exam Vitals signs reviewed.  Constitutional:      General: He is not in acute distress.    Appearance: He is well-developed.  HENT:     Head: Normocephalic.     Right Ear: Tympanic membrane normal.     Left Ear: Tympanic membrane normal.     Nose: Rhinorrhea present.     Mouth/Throat:     Pharynx: Posterior oropharyngeal erythema present.  Eyes:     General:        Right eye: No discharge.        Left eye: No discharge.     Pupils: Pupils are equal, round, and reactive to light.  Neck:     Musculoskeletal: Normal range of motion and neck supple.     Thyroid: No thyromegaly.  Cardiovascular:     Rate and Rhythm: Normal rate and regular rhythm.     Heart sounds: Normal heart sounds. No murmur.  Pulmonary:     Effort: Pulmonary effort is normal. No respiratory  distress.     Breath sounds: Normal breath sounds. No wheezing.     Comments: Constant nonproductive cough Abdominal:     General: Bowel sounds are normal. There is no distension.     Palpations: Abdomen is soft.     Tenderness: There is no abdominal tenderness.  Musculoskeletal: Normal range of motion.        General: No tenderness.  Skin:    General: Skin is warm and dry.     Findings: No erythema or rash.  Neurological:     Mental Status: He is alert and oriented to person, place, and time.     Cranial Nerves: No cranial nerve deficit.     Deep Tendon Reflexes: Reflexes are normal and symmetric.  Psychiatric:        Behavior: Behavior normal.        Thought Content: Thought content normal.        Judgment: Judgment normal.       BP 114/78   Pulse 74   Temp 98.4 F (36.9 C) (Oral)   Ht 5\' 7"  (1.702 m)   Wt 176 lb 6.4 oz (80 kg)   BMI 27.63 kg/m      Assessment & Plan:  Christopher Haas comes in today with chief complaint of Cough   Diagnosis and orders addressed:  1. Acute bacterial bronchitis - Take meds as prescribed - Use a cool mist humidifier  -Use saline nose sprays frequently -Force fluids -For any cough or congestion  Use plain Mucinex- regular strength or max strength is fine -For fever or aces or pains- take tylenol or ibuprofen. -Throat lozenges if help -RTO if symptoms worsen or do not improve  - cetirizine (ZYRTEC) 10 MG tablet; Take 1 tablet (10 mg total) by mouth daily.  Dispense: 30 tablet; Refill: 11 - predniSONE (STERAPRED UNI-PAK 21 TAB) 10 MG (21) TBPK tablet; Use as directed  Dispense: 21 tablet; Refill: 0 - doxycycline (VIBRA-TABS) 100 MG tablet; Take 1 tablet (100 mg total) by mouth 2 (two) times daily.  Dispense: 20 tablet; Refill: 0 - DG Chest 2 View; Future   Jannifer Rodney, FNP

## 2018-12-30 NOTE — Patient Instructions (Signed)

## 2019-01-02 ENCOUNTER — Other Ambulatory Visit (INDEPENDENT_AMBULATORY_CARE_PROVIDER_SITE_OTHER): Payer: Commercial Managed Care - PPO

## 2019-01-02 DIAGNOSIS — B9689 Other specified bacterial agents as the cause of diseases classified elsewhere: Secondary | ICD-10-CM | POA: Diagnosis not present

## 2019-01-02 DIAGNOSIS — J208 Acute bronchitis due to other specified organisms: Secondary | ICD-10-CM

## 2019-01-02 DIAGNOSIS — R05 Cough: Secondary | ICD-10-CM | POA: Diagnosis not present

## 2019-01-03 ENCOUNTER — Telehealth: Payer: Self-pay | Admitting: Family Medicine

## 2019-01-03 NOTE — Telephone Encounter (Signed)
Wife answered phone and is not on DPR; [facility]

## 2019-01-12 DIAGNOSIS — K5180 Other ulcerative colitis without complications: Secondary | ICD-10-CM | POA: Diagnosis not present

## 2019-03-21 ENCOUNTER — Ambulatory Visit (INDEPENDENT_AMBULATORY_CARE_PROVIDER_SITE_OTHER): Payer: Commercial Managed Care - PPO | Admitting: Family Medicine

## 2019-03-21 ENCOUNTER — Encounter: Payer: Self-pay | Admitting: Family Medicine

## 2019-03-21 DIAGNOSIS — T7840XD Allergy, unspecified, subsequent encounter: Secondary | ICD-10-CM

## 2019-03-21 DIAGNOSIS — J302 Other seasonal allergic rhinitis: Secondary | ICD-10-CM | POA: Diagnosis not present

## 2019-03-21 DIAGNOSIS — K2100 Gastro-esophageal reflux disease with esophagitis, without bleeding: Secondary | ICD-10-CM

## 2019-03-21 MED ORDER — OMEPRAZOLE 40 MG PO CPDR: 40.0000 mg | DELAYED_RELEASE_CAPSULE | Freq: Every day | ORAL | 3 refills | Status: DC

## 2019-03-21 MED ORDER — PREDNISONE 20 MG PO TABS: ORAL_TABLET | ORAL | 0 refills | Status: DC

## 2019-03-21 NOTE — Progress Notes (Signed)
Virtual Visit via telephone Note  I connected with Christopher Haas on 03/21/19 at 1605 by telephone and verified that I am speaking with the correct person using two identifiers. Christopher Haas is currently located at home and no other people are currently with her during visit. The provider, Elige RadonJoshua A Keren Alverio, MD is located in their office at time of visit.  Call ended at 1614  I discussed the limitations, risks, security and privacy concerns of performing an evaluation and management service by telephone and the availability of in person appointments. I also discussed with the patient that there may be a patient responsible charge related to this service. The patient expressed understanding and agreed to proceed.   History and Present Illness: Patient has cough and phlegm and he has been fighting this more over the past 4 days.  He says the symptoms are worse in the night.  He has globus sensation.  Dry throat and dry cough. He was around smoke and felt irritated after that. He feels like he has this irritation worsened since then but he does fight this cough and allergies.  1. Gastroesophageal reflux disease with esophagitis   2. Allergic state, subsequent encounter     Outpatient Encounter Medications as of 03/21/2019  Medication Sig  . cetirizine (ZYRTEC) 10 MG tablet Take 1 tablet (10 mg total) by mouth daily.  Marland Kitchen. doxycycline (VIBRA-TABS) 100 MG tablet Take 1 tablet (100 mg total) by mouth 2 (two) times daily.  . fluticasone (FLONASE) 50 MCG/ACT nasal spray Place 2 sprays into both nostrils daily.  . folic acid (FOLVITE) 1 MG tablet Take 1 mg by mouth daily.  Marland Kitchen. inFLIXimab (REMICADE) 100 MG injection Inject into the vein. Every 2 months  . methotrexate (RHEUMATREX) 2.5 MG tablet Take by mouth. 10 tablets once a week  . omeprazole (PRILOSEC) 40 MG capsule Take 1 capsule (40 mg total) by mouth daily.  . predniSONE (DELTASONE) 20 MG tablet 2 po at same time daily for 5 days  .  [DISCONTINUED] predniSONE (STERAPRED UNI-PAK 21 TAB) 10 MG (21) TBPK tablet Use as directed   No facility-administered encounter medications on file as of 03/21/2019.     Review of Systems  Constitutional: Negative for chills and fever.  Eyes: Negative for visual disturbance.  Respiratory: Negative for shortness of breath and wheezing.   Cardiovascular: Negative for chest pain and leg swelling.  Musculoskeletal: Negative for back pain and gait problem.  Skin: Negative for rash.  Neurological: Negative for dizziness, weakness and numbness.  All other systems reviewed and are negative.   Observations/Objective: Patient sounds comfortable and in no acute distress  Assessment and Plan: Problem List Items Addressed This Visit    None    Visit Diagnoses    Gastroesophageal reflux disease with esophagitis    -  Primary   Relevant Medications   omeprazole (PRILOSEC) 40 MG capsule   Allergic state, subsequent encounter       Relevant Medications   predniSONE (DELTASONE) 20 MG tablet       Follow Up Instructions: As needed    I discussed the assessment and treatment plan with the patient. The patient was provided an opportunity to ask questions and all were answered. The patient agreed with the plan and demonstrated an understanding of the instructions.   The patient was advised to call back or seek an in-person evaluation if the symptoms worsen or if the condition fails to improve as anticipated.  The above assessment and management plan  was discussed with the patient. The patient verbalized understanding of and has agreed to the management plan. Patient is aware to call the clinic if symptoms persist or worsen. Patient is aware when to return to the clinic for a follow-up visit. Patient educated on when it is appropriate to go to the emergency department.    I provided 9 minutes of non-face-to-face time during this encounter.    Nils Pyle, MD

## 2019-04-06 DIAGNOSIS — K5180 Other ulcerative colitis without complications: Secondary | ICD-10-CM | POA: Diagnosis not present

## 2019-10-08 ENCOUNTER — Ambulatory Visit (INDEPENDENT_AMBULATORY_CARE_PROVIDER_SITE_OTHER): Payer: Commercial Managed Care - PPO

## 2019-10-08 DIAGNOSIS — Z23 Encounter for immunization: Secondary | ICD-10-CM | POA: Diagnosis not present

## 2019-12-25 ENCOUNTER — Other Ambulatory Visit: Payer: Self-pay | Admitting: Family Medicine

## 2019-12-25 DIAGNOSIS — K2100 Gastro-esophageal reflux disease with esophagitis, without bleeding: Secondary | ICD-10-CM

## 2020-04-11 IMAGING — DX DG CHEST 2V
2 series · 2 of 2 positions shown · non-contrast
Comparison: 10/18/2018

CLINICAL DATA: Persistent cough

EXAM:
CHEST - 2 VIEW

[chest pa]
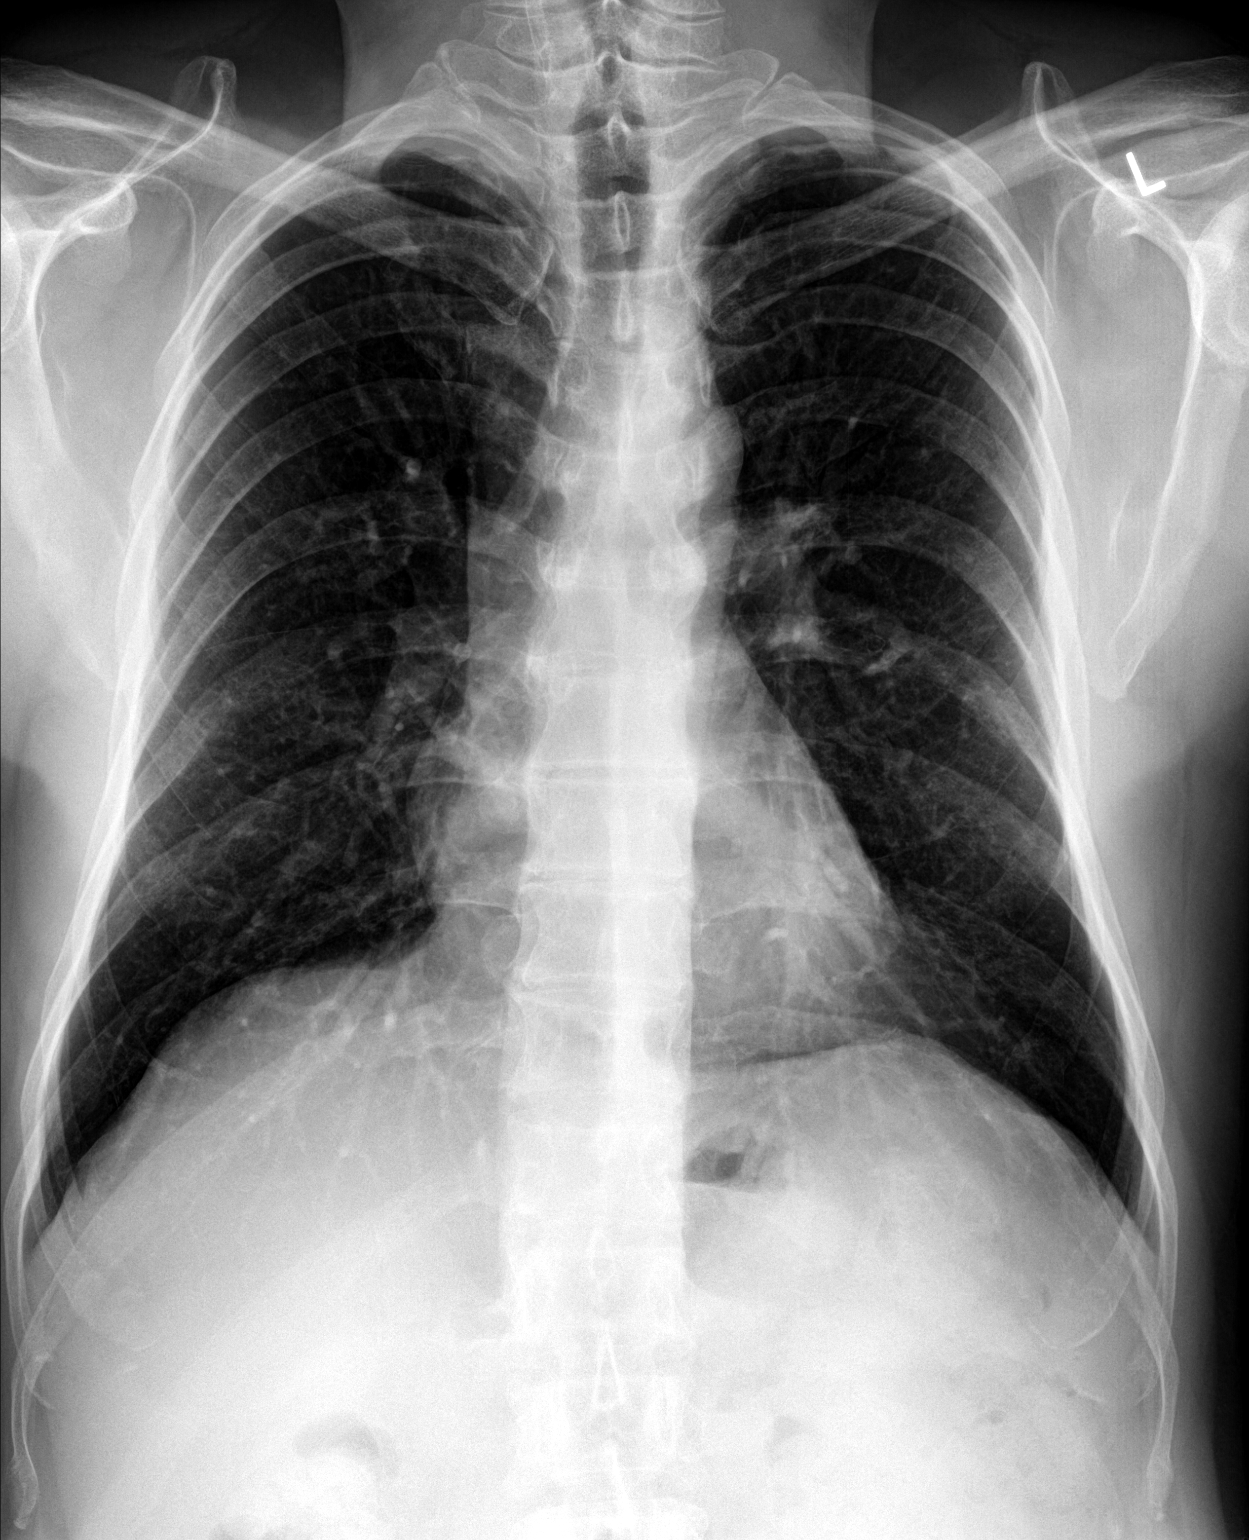

[chest lat]
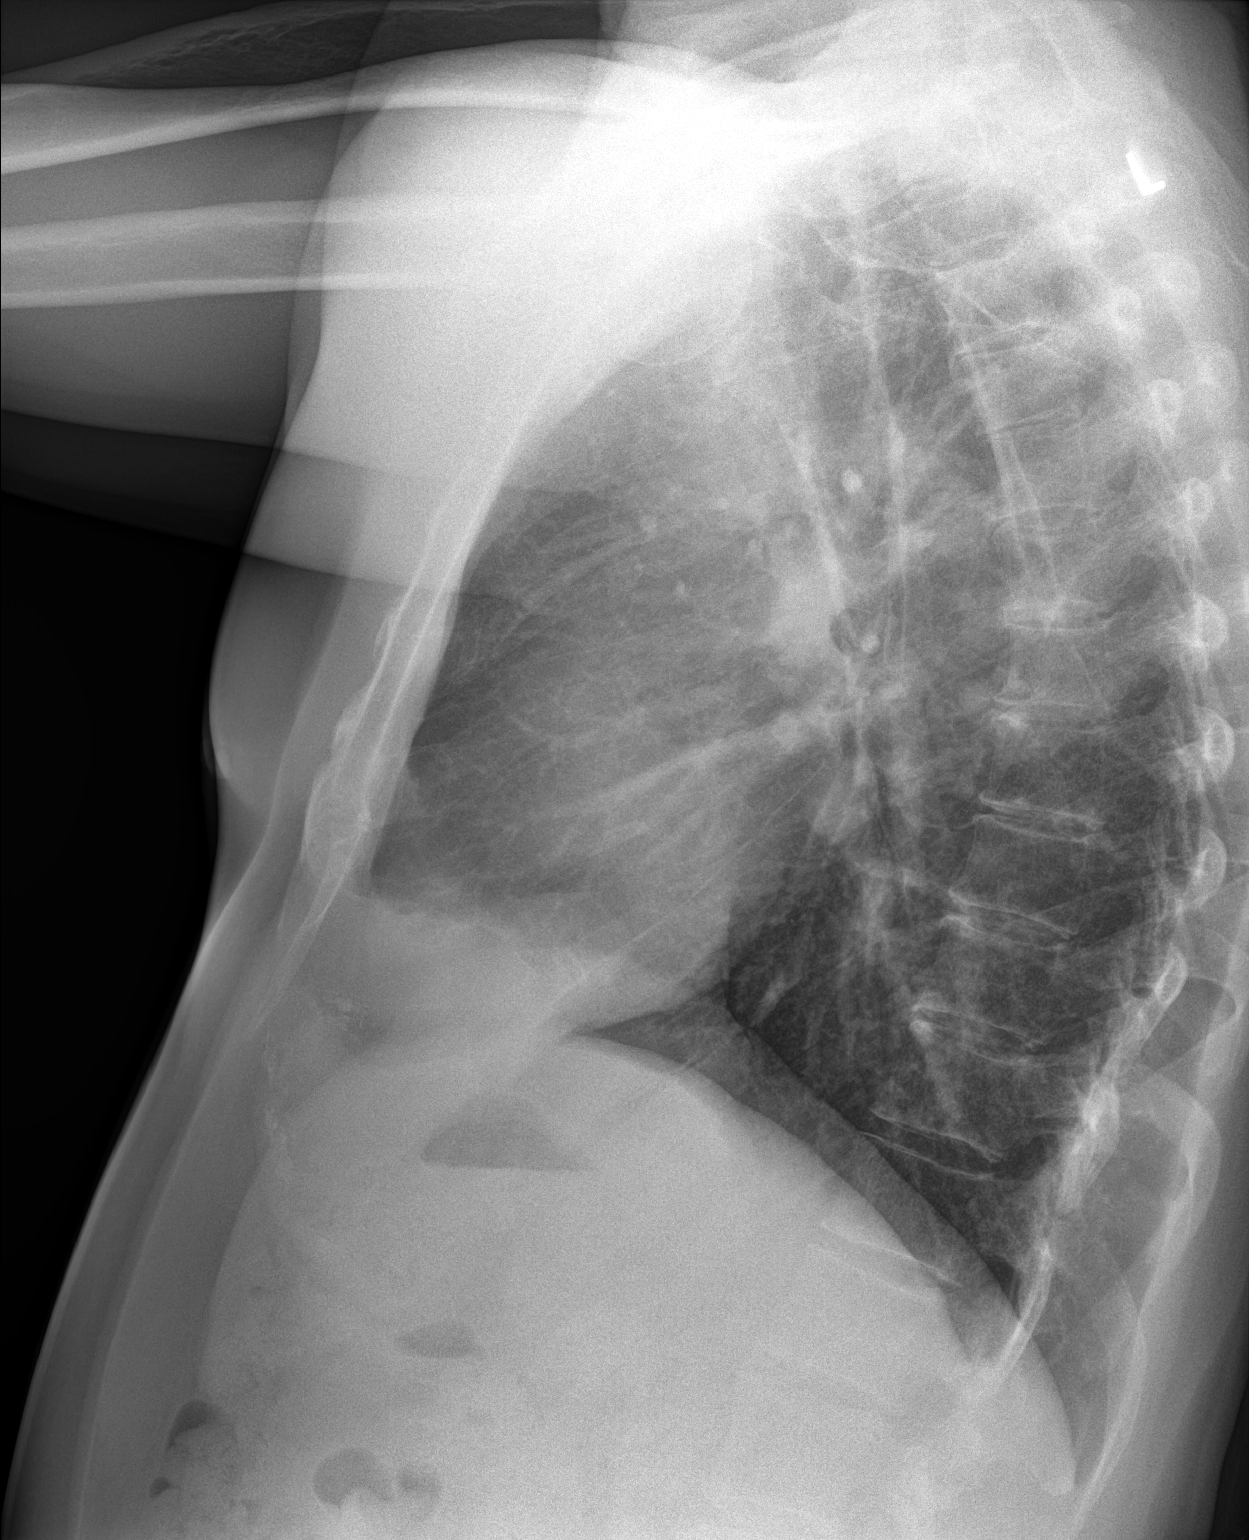

[2 of 2 positions shown; findings below may reference images not displayed]

FINDINGS: The heart size and mediastinal contours are within normal limits.
Both lungs are clear. The visualized skeletal structures are
unremarkable.
IMPRESSION: No acute abnormality of the lungs.

## 2020-04-22 ENCOUNTER — Other Ambulatory Visit: Payer: Self-pay | Admitting: Family Medicine

## 2020-04-22 DIAGNOSIS — K2100 Gastro-esophageal reflux disease with esophagitis, without bleeding: Secondary | ICD-10-CM

## 2020-08-27 ENCOUNTER — Other Ambulatory Visit: Payer: Self-pay | Admitting: Family Medicine

## 2020-08-27 DIAGNOSIS — K2100 Gastro-esophageal reflux disease with esophagitis, without bleeding: Secondary | ICD-10-CM

## 2020-09-12 ENCOUNTER — Ambulatory Visit (INDEPENDENT_AMBULATORY_CARE_PROVIDER_SITE_OTHER): Payer: Commercial Managed Care - PPO

## 2020-09-12 ENCOUNTER — Other Ambulatory Visit: Payer: Self-pay

## 2020-09-12 DIAGNOSIS — Z23 Encounter for immunization: Secondary | ICD-10-CM | POA: Diagnosis not present

## 2021-03-09 ENCOUNTER — Ambulatory Visit (INDEPENDENT_AMBULATORY_CARE_PROVIDER_SITE_OTHER): Payer: Commercial Managed Care - PPO | Admitting: Family Medicine

## 2021-03-09 ENCOUNTER — Encounter: Payer: Self-pay | Admitting: Family Medicine

## 2021-03-09 VITALS — BP 110/60 | HR 61 | Temp 97.9°F

## 2021-03-09 DIAGNOSIS — J3489 Other specified disorders of nose and nasal sinuses: Secondary | ICD-10-CM | POA: Diagnosis not present

## 2021-03-09 DIAGNOSIS — J301 Allergic rhinitis due to pollen: Secondary | ICD-10-CM

## 2021-03-09 MED ORDER — MUPIROCIN 2 % EX OINT: 1.0000 "application " | TOPICAL_OINTMENT | Freq: Two times a day (BID) | CUTANEOUS | 0 refills | Status: AC

## 2021-03-09 MED ORDER — CETIRIZINE HCL 10 MG PO TABS: 10.0000 mg | ORAL_TABLET | Freq: Every day | ORAL | 11 refills | Status: DC

## 2021-03-09 NOTE — Progress Notes (Signed)
Acute Office Visit  Subjective:    Patient ID: Christopher Haas, male    DOB: 01/08/1961, 60 y.o.   MRN: [id number]  Chief Complaint  Patient presents with  . Nasal Congestion    HPI Patient is in today for nasal congestion x4 days. Also reports sneezing and itching of his nose. He denies fever, headache, sore throat, ear pain, cough, shortness of breath, chest pain, nausea, vomiting, or diarrhea. He has a few sores under his nose where he has been wiping his nose so much. He takes flonase. He has been taking allegra daily as well.  Past Medical History:  Diagnosis Date  . Ulcerative colitis ([place])     No past surgical history on file.  No family history on file.  Social History   Socioeconomic History  . Marital status: Married    Spouse name: Not on file  . Number of children: Not on file  . Years of education: Not on file  . Highest education level: Not on file  Occupational History  . Not on file  Tobacco Use  . Smoking status: Never Smoker  . Smokeless tobacco: Never Used  Substance and Sexual Activity  . Alcohol use: No  . Drug use: No  . Sexual activity: Not on file  Other Topics Concern  . Not on file  Social History Narrative  . Not on file   Social Determinants of Health   Financial Resource Strain: Not on file  Food Insecurity: Not on file  Transportation Needs: Not on file  Physical Activity: Not on file  Stress: Not on file  Social Connections: Not on file  Intimate Partner Violence: Not on file    Outpatient Medications Prior to Visit  Medication Sig Dispense Refill  . folic acid (FOLVITE) 1 MG tablet Take 1 mg by mouth daily.    Marland Kitchen inFLIXimab (REMICADE) 100 MG injection Inject into the vein. Every 2 months    . methotrexate (RHEUMATREX) 2.5 MG tablet Take by mouth. 10 tablets once a week    . cetirizine (ZYRTEC) 10 MG tablet Take 1 tablet (10 mg total) by mouth daily. (Patient not taking: Reported on 03/09/2021) 30 tablet 11  . doxycycline  (VIBRA-TABS) 100 MG tablet Take 1 tablet (100 mg total) by mouth 2 (two) times daily. (Patient not taking: Reported on 03/09/2021) 20 tablet 0  . fluticasone (FLONASE) 50 MCG/ACT nasal spray Place 2 sprays into both nostrils daily. (Patient not taking: Reported on 03/09/2021) 16 g 6  . omeprazole (PRILOSEC) 40 MG capsule Take 1 capsule (40 mg total) by mouth daily. (Needs to be seen before next refill) (Patient not taking: Reported on 03/09/2021) 30 capsule 0  . predniSONE (DELTASONE) 20 MG tablet 2 po at same time daily for 5 days (Patient not taking: Reported on 03/09/2021) 10 tablet 0   No facility-administered medications prior to visit.    No Known Allergies  Review of Systems As per HPI.     Objective:    Physical Exam Vitals and nursing note reviewed.  Constitutional:      General: He is not in acute distress.    Appearance: Normal appearance. He is not ill-appearing, toxic-appearing or diaphoretic.  HENT:     Head: Normocephalic and atraumatic.     Right Ear: Tympanic membrane, ear canal and external ear normal.     Left Ear: Tympanic membrane, ear canal and external ear normal.     Nose: Congestion present.     Mouth/Throat:  Mouth: Mucous membranes are moist.     Pharynx: Oropharynx is clear. No oropharyngeal exudate or posterior oropharyngeal erythema.  Eyes:     Conjunctiva/sclera: Conjunctivae normal.     Pupils: Pupils are equal, round, and reactive to light.  Cardiovascular:     Rate and Rhythm: Normal rate and regular rhythm.     Heart sounds: Normal heart sounds. No murmur heard.   Pulmonary:     Effort: Pulmonary effort is normal. No respiratory distress.     Breath sounds: Normal breath sounds.  Musculoskeletal:     Right lower leg: No edema.     Left lower leg: No edema.  Skin:    General: Skin is warm and dry.  Neurological:     General: No focal deficit present.     Mental Status: He is alert and oriented to person, place, and time.  Psychiatric:         Mood and Affect: Mood normal.        Behavior: Behavior normal.     BP 110/60   Pulse 61   Temp 97.9 F (36.6 C) (Temporal)  Wt Readings from Last 3 Encounters:  12/30/18 176 lb 6.4 oz (80 kg)  11/20/18 175 lb (79.4 kg)  10/18/18 177 lb 9.6 oz (80.6 kg)    Health Maintenance Due  Topic Date Due  . Hepatitis C Screening  Never done  . HIV Screening  Never done  . TETANUS/TDAP  Never done  . COLONOSCOPY (Pts 45-80yrs Insurance coverage will need to be confirmed)  Never done    There are no preventive care reminders to display for this patient.   No results found for: TSH Lab Results  Component Value Date   WBC 7.0 09/30/2018   HGB 16.3 09/30/2018   HCT 46.7 09/30/2018   MCV 89 09/30/2018   PLT 238 09/30/2018   No results found for: NA, K, CHLORIDE, CO2, GLUCOSE, BUN, CREATININE, BILITOT, ALKPHOS, AST, ALT, PROT, ALBUMIN, CALCIUM, ANIONGAP, EGFR, GFR No results found for: CHOL No results found for: HDL No results found for: LDLCALC No results found for: TRIG No results found for: CHOLHDL No results found for: HGBA1C     Assessment & Plan:   Christopher Haas was seen today for nasal congestion.  Diagnoses and all orders for this visit:  Seasonal allergic rhinitis due to pollen Continue flonase. Change from allegra to Zyrtec.  -     cetirizine (ZYRTEC) 10 MG tablet; Take 1 tablet (10 mg total) by mouth daily.  Infected lesion in nose Bactroban as below.  -     mupirocin ointment (BACTROBAN) 2 %; Apply 1 application topically 2 (two) times daily for 5 days.  Return to office for new or worsening symptoms, or if symptoms persist.    Christopher Perking, FNP

## 2021-06-18 ENCOUNTER — Ambulatory Visit: Payer: Commercial Managed Care - PPO | Admitting: Family Medicine

## 2021-06-18 ENCOUNTER — Other Ambulatory Visit: Payer: Self-pay

## 2021-06-18 ENCOUNTER — Encounter: Payer: Self-pay | Admitting: Family Medicine

## 2021-06-18 VITALS — BP 114/76 | HR 74 | Temp 97.4°F | Ht 67.0 in | Wt 174.8 lb

## 2021-06-18 DIAGNOSIS — B356 Tinea cruris: Secondary | ICD-10-CM | POA: Diagnosis not present

## 2021-06-18 MED ORDER — CLOTRIMAZOLE-BETAMETHASONE 1-0.05 % EX CREA: 1.0000 "application " | TOPICAL_CREAM | Freq: Two times a day (BID) | CUTANEOUS | 0 refills | Status: DC

## 2021-06-18 NOTE — Patient Instructions (Signed)
[name] inguinal Jock Itch La tia inguinal (tinea cruris) es una infeccin de la piel en la zona de la ingle. Es causada por un hongo, que es un tipo de germen que vive en lugares oscuros y [employer]. La tia inguinal causa una erupcin cutnea que produce picazn en la ingle y la parte superior de los muslos. Por lo general, desaparece en 2 a 3 semanas detratamiento. Cules son las causas? El hongo que causa la tia inguinal se puede transmitir: Al tocar una infeccin por hongos en otra parte del cuerpo, como pie de atleta, y luego tocar la zona de la ingle. Al compartir toallas o ropa, como medias o zapatos, con alguien que tenga una infeccin por hongos. Qu incrementa el riesgo? La tia inguinal es ms frecuente en los hombres y los varones adolescentes. Tambin es ms probable que desarrolle la afeccin si: Est en un clima caluroso y hmedo. [place] ropa ajustada o trajes de bao mojados durante largos perodos de [place]. Practican deportes. Tienen sobrepeso. Tienen diabetes. Tienen el sistema inmunitario debilitado. Wendall Stade. Cules son los signos o sntomas? Los sntomas de la tia inguinal pueden incluir: Erupcin cutnea roja, rosada o marrn en la zona de la ingle. Puede haber ampollas. La erupcin cutnea se puede propagar [employer], el ano, y las [place]. Piel seca y escamosa dentro o alrededor de la erupcin. Picazn. Cmo se diagnostica? En la [employer], el mdico puede hacer el diagnstico observando la erupcin cutnea. En algunos casos, se toma una [place] de piel infectada mediante raspado. Ronna Polio se puede examinar con un microscopio (biopsia) o intentando desarrollar el hongo a partir de la [place] (cultivo). Cmo se trata? El tratamiento de esta afeccin puede incluir lo siguiente: Medicamentos antimicticos para Radio producer. Estos pueden ser Neomia Dear crema, ungento o polvo para la piel, o puede ser un medicamento que se toma por boca. Crema o  ungento para la piel para reducir Higher education careers adviser. Cambios en el estilo de vida, como el uso de ropa ms holgada y el cuidado de la piel. Siga estas instrucciones en su casa: Cuidado de la piel Aplquese cremas, ungentos o polvos para la piel exactamente como se lo haya indicado el mdico. Use ropa holgada que no se roce contra la zona de la ingle. Los hombres deben usar calzoncillos o ropa interior [place]. Mantenga la zona de la ingle limpia y seca. Cmbiese la ropa interior [employer]. Cmbiese los trajes de bao mojados lo ms pronto posible. Despus del bao, use una toalla aparte para secar la zona de la ingle con suavidad y en su totalidad. Usar un toalla aparte ayudar a prevenir la propagacin de la infeccin a otras zonas del cuerpo. Evite baos y duchas calientes. El agua caliente puede empeorar la picazn. No se rasque la zona afectada. Indicaciones generales [place] y [employer] medicamentos de venta libre y los recetados solamente como se lo haya indicado el mdico. No comparta toallas, ropa ni elementos personales con Economist. Lvese las manos con agua y jabn con frecuencia, especialmente despus de tocarse la zona de la ingle. Usar desinfectante para manos con alcohol si no dispone de [place] y [place]. Comunquese con un mdico si: La erupcin cutnea: Empeora o no mejora luego de 2 semanas de tratamiento. Productos untables. Se vuelve a manifestar una vez finalizado Scientist, research (medical). Tiene alguno de los siguientes sntomas: Grant Ruts. Enrojecimiento, hinchazn o dolor alrededor de la erupcin cutnea, nuevos o empeoramiento de los ya existentes. Lquido,  sangre o pus que salen de la erupcin cutnea. Resumen La tia inguinal (tinea cruris) es una infeccin por hongos de la piel en la zona de la ingle. El hongo se puede propagar al compartir ropa o al tocar una infeccin por hongos en otra parte del cuerpo y luego tocar la zona de la ingle. El tratamiento puede incluir  medicamentos antifngicos y cambios en el estilo de vida, como mantener la zona limpia y [place]. Esta informacin no tiene Theme park manager el consejo del mdico. Asegresede hacerle al mdico cualquier pregunta que tenga. Document Revised: 09/19/2020 Document Reviewed: 09/19/2020 Elsevier Patient Education  2022 [employer].

## 2021-06-18 NOTE — Progress Notes (Signed)
Acute Office Visit  Subjective:    Patient ID: Christopher Haas, male    DOB: Jul 06, 1961, 60 y.o.   MRN: [id number]  Chief Complaint  Patient presents with   Rash    HPI Patient is in today for a rash on his right thigh for 2 weeks. The area is red. It is itchy. It has not gotten larger. Denies fever, tenderness, or drainage. He has not used any remedies.  He has had this happened a few years ago but it went away on it's own.   Past Medical History:  Diagnosis Date   Ulcerative colitis ([place])     History reviewed. No pertinent surgical history.  History reviewed. No pertinent family history.  Social History   Socioeconomic History   Marital status: Married    Spouse name: Not on file   Number of children: Not on file   Years of education: Not on file   Highest education level: Not on file  Occupational History   Not on file  Tobacco Use   Smoking status: Never   Smokeless tobacco: Never  Substance and Sexual Activity   Alcohol use: No   Drug use: No   Sexual activity: Not on file  Other Topics Concern   Not on file  Social History Narrative   Not on file   Social Determinants of Health   Financial Resource Strain: Not on file  Food Insecurity: Not on file  Transportation Needs: Not on file  Physical Activity: Not on file  Stress: Not on file  Social Connections: Not on file  Intimate Partner Violence: Not on file    Outpatient Medications Prior to Visit  Medication Sig Dispense Refill   cetirizine (ZYRTEC) 10 MG tablet Take 1 tablet (10 mg total) by mouth daily. 30 tablet 11   fluticasone (FLONASE) 50 MCG/ACT nasal spray Place 2 sprays into both nostrils daily. 16 g 6   folic acid (FOLVITE) 1 MG tablet Take 1 mg by mouth daily.     inFLIXimab (REMICADE) 100 MG injection Inject into the vein. Every 2 months     methotrexate (RHEUMATREX) 2.5 MG tablet Take by mouth. 10 tablets once a week     omeprazole (PRILOSEC) 40 MG capsule Take 1 capsule (40 mg  total) by mouth daily. (Needs to be seen before next refill) 30 capsule 0   doxycycline (VIBRA-TABS) 100 MG tablet Take 1 tablet (100 mg total) by mouth 2 (two) times daily. (Patient not taking: Reported on 03/09/2021) 20 tablet 0   predniSONE (DELTASONE) 20 MG tablet 2 po at same time daily for 5 days (Patient not taking: Reported on 03/09/2021) 10 tablet 0   No facility-administered medications prior to visit.    No Known Allergies  Review of Systems As per HPI.     Objective:    Physical Exam Vitals and nursing note reviewed.  Constitutional:      Appearance: Normal appearance.  Pulmonary:     Effort: Pulmonary effort is normal. No respiratory distress.     Breath sounds: Normal breath sounds.  Skin:    General: Skin is warm and dry.     Findings: Rash (slightly elevated, hyperpigmented patch to righ proximal, medial thigh with sharply demarcated border.) present.  Neurological:     Mental Status: He is alert and oriented to person, place, and time.  Psychiatric:        Mood and Affect: Mood normal.        Behavior: Behavior normal.  BP 114/76   Pulse 74   Temp (!) 97.4 F (36.3 C) (Temporal)   Ht 5' 7" (1.702 m)   Wt 174 lb 12.8 oz (79.3 kg)   SpO2 98%   BMI 27.38 kg/m  Wt Readings from Last 3 Encounters:  06/18/21 174 lb 12.8 oz (79.3 kg)  12/30/18 176 lb 6.4 oz (80 kg)  11/20/18 175 lb (79.4 kg)    Health Maintenance Due  Topic Date Due   HIV Screening  Never done   Hepatitis C Screening  Never done   TETANUS/TDAP  Never done   Zoster Vaccines- Shingrix (1 of 2) Never done   COLONOSCOPY (Pts 45-30yr Insurance coverage will need to be confirmed)  Never done   Pneumococcal Vaccine 027651Years old (3 - PPSV23 or PCV20) 07/03/2019    There are no preventive care reminders to display for this patient.   No results found for: TSH Lab Results  Component Value Date   WBC 7.0 09/30/2018   HGB 16.3 09/30/2018   HCT 46.7 09/30/2018   MCV 89 09/30/2018    PLT 238 09/30/2018   No results found for: NA, K, CHLORIDE, CO2, GLUCOSE, BUN, CREATININE, BILITOT, ALKPHOS, AST, ALT, PROT, ALBUMIN, CALCIUM, ANIONGAP, EGFR, GFR No results found for: CHOL No results found for: HDL No results found for: LDLCALC No results found for: TRIG No results found for: CHOLHDL No results found for: HGBA1C     Assessment & Plan:   GKravenwas seen today for rash.  Diagnoses and all orders for this visit:  Jock itch Lotrisone cream as below. Handout given. Return to office for new or worsening symptoms, or if symptoms persist.  -     clotrimazole-betamethasone (LOTRISONE) cream; Apply 1 application topically 2 (two) times daily.  The patient indicates understanding of these issues and agrees with the plan.  TGwenlyn Perking FNP

## 2021-07-13 ENCOUNTER — Other Ambulatory Visit: Payer: Self-pay | Admitting: Family Medicine

## 2021-07-13 DIAGNOSIS — B356 Tinea cruris: Secondary | ICD-10-CM

## 2021-08-31 ENCOUNTER — Other Ambulatory Visit: Payer: Self-pay

## 2021-08-31 ENCOUNTER — Ambulatory Visit (INDEPENDENT_AMBULATORY_CARE_PROVIDER_SITE_OTHER): Payer: Commercial Managed Care - PPO

## 2021-08-31 DIAGNOSIS — Z23 Encounter for immunization: Secondary | ICD-10-CM | POA: Diagnosis not present

## 2021-09-28 ENCOUNTER — Other Ambulatory Visit: Payer: Self-pay | Admitting: Family Medicine

## 2021-09-28 DIAGNOSIS — B356 Tinea cruris: Secondary | ICD-10-CM

## 2021-10-08 ENCOUNTER — Other Ambulatory Visit: Payer: Self-pay | Admitting: Family Medicine

## 2021-10-08 DIAGNOSIS — K2100 Gastro-esophageal reflux disease with esophagitis, without bleeding: Secondary | ICD-10-CM

## 2021-10-09 ENCOUNTER — Other Ambulatory Visit: Payer: Self-pay

## 2021-10-09 ENCOUNTER — Encounter: Payer: Self-pay | Admitting: Family Medicine

## 2021-10-09 ENCOUNTER — Ambulatory Visit: Payer: Commercial Managed Care - PPO | Admitting: Family Medicine

## 2021-10-09 VITALS — BP 121/73 | HR 69 | Temp 96.0°F | Resp 20 | Ht 67.0 in | Wt 178.0 lb

## 2021-10-09 DIAGNOSIS — B356 Tinea cruris: Secondary | ICD-10-CM

## 2021-10-09 MED ORDER — CLOTRIMAZOLE-BETAMETHASONE 1-0.05 % EX CREA: TOPICAL_CREAM | Freq: Two times a day (BID) | CUTANEOUS | 0 refills | Status: DC

## 2021-10-09 MED ORDER — FLUCONAZOLE 150 MG PO TABS: ORAL_TABLET | ORAL | 0 refills | Status: DC

## 2021-10-09 NOTE — Patient Instructions (Signed)
[name] inguinal Jock Itch La tia inguinal (tinea cruris) es una infeccin de la piel en la zona de la ingle que es causada por un hongo. La tia inguinal causa una erupcin cutnea que produce picazn en la ingle y la parte superior de los muslos. Por lo general, desaparece en 2 a 3 semanas de tratamiento. Cules son las causas? El hongo que causa la tia inguinal se puede transmitir: Al tocar una infeccin por hongos en otra parte del cuerpo, como pie de atleta, y luego tocar la zona de la ingle. Al compartir toallas o ropa, como medias o zapatos, con alguien que tenga una infeccin por hongos. Qu incrementa el riesgo? La tia inguinal es ms frecuente en los hombres y los varones adolescentes. Tambin es ms probable que desarrolle la afeccin si: Est en un clima caluroso y hmedo. [place] ropa ajustada o trajes de bao mojados durante largos perodos de [place]. Practica deportes. Tiene sobrepeso. Tiene diabetes. Tiene debilitado el sistema de defensa del organismo (sistema inmunitario). Christopher Haas. Cules son los signos o sntomas? Los sntomas de la tia inguinal pueden incluir: Erupcin cutnea roja, rosada o marrn en la zona de la ingle. Puede haber ampollas. La erupcin puede extenderse a los muslos, la abertura entre las nalgas (ano) y las nalgas. Piel seca y escamosa dentro o alrededor de la erupcin. Picazn. Cmo se diagnostica? En la [employer], el mdico puede hacer el diagnstico observando la erupcin cutnea. En algunos casos, se toma una [place] de piel infectada mediante raspado. Christopher Haas se puede examinar con un microscopio (biopsia) o intentando desarrollar el hongo a partir de la [place] (cultivo). Cmo se trata? El tratamiento de esta afeccin puede incluir lo siguiente: Medicamentos antimicticos para Radio producer. Estos pueden ser Neomia Dear crema, ungento o polvo para la piel, o puede ser un medicamento que se toma por boca (por va oral). Crema o  ungento para la piel para reducir Higher education careers adviser. Cambios en el estilo de vida, como el uso de ropa ms holgada y el cuidado de la piel. Siga estas indicaciones en su casa: Cuidado de la piel Aplquese cremas, ungentos o polvos para la piel exactamente como se lo haya indicado el mdico. Use ropa holgada que no se roce contra la zona de la ingle. Los hombres deben usar calzoncillos o ropa interior [place]. Mantenga la zona de la ingle limpia y seca. Cmbiese la ropa interior [employer]. Cmbiese los trajes de bao mojados lo ms pronto posible. Despus del bao, use una toalla aparte para secar la zona de la ingle con suavidad y en su totalidad. Usar una toalla aparte ayudar a prevenir la propagacin de la infeccin a otras zonas del cuerpo. Evite baos y duchas calientes. El agua caliente puede empeorar la picazn. No se rasque la zona afectada. Indicaciones generales Use y aplquese los medicamentos de venta libre y los recetados solamente como se lo haya indicado el mdico. No comparta toallas, ropa ni elementos personales con [employer]. Lvese las manos frecuentemente con agua y jabn durante al menos 20 segundos, en especial despus de tocarse la zona de la ingle. Use desinfectante para manos si no dispone de [place] y [place]. Cuando est en el gimnasio: Use siempre calzado, especialmente en la ducha y alrededor de la piscina. Mantenga los cortes cubiertos. Desinfecte cualquier alfombra o equipo antes de usarlos. Dchese inmediatamente despus del entrenamiento. Concurra a todas las visitas de seguimiento. Esto es importante. Comunquese con un mdico si: La erupcin  cutnea: Empeora o no mejora luego de 2 semanas de tratamiento. Se extiende. Se vuelve a manifestar una vez finalizado Scientist, research (medical). Tiene alguno de los siguientes sntomas: Grant Ruts. Enrojecimiento, hinchazn o dolor alrededor de la erupcin cutnea, nuevos o empeoramiento de los ya existentes. Lquido, sangre o  pus que salen de la erupcin cutnea. Resumen La tia inguinal (tinea cruris) es una infeccin por hongos de la piel en la zona de la ingle. El hongo se puede propagar al compartir ropa o al tocar una infeccin por hongos en otra parte del cuerpo y luego tocar la zona de la ingle. El tratamiento puede incluir medicamentos antifngicos y cambios en el estilo de vida, como mantener la zona limpia y [place]. Esta informacin no tiene Theme park manager el consejo del mdico. Asegrese de hacerle al mdico cualquier pregunta que tenga. Document Revised: 02/24/2021 Document Reviewed: 02/24/2021 Elsevier Patient Education  2022 [employer].

## 2021-10-09 NOTE — Progress Notes (Signed)
Acute Office Visit  Subjective:    Patient ID: Christopher Haas, male    DOB: 1961/04/10, 60 y.o.   MRN: [id number]  Chief Complaint  Patient presents with   Lesion on leg    HPI Patient is in today for a rash on his upper right thigh. This has been present for a few months. He has been using lotrisone cream with improvement. He is now out of this and the rash has not completely cleared. He denies fever, itching, pain, or drainage. The rash has not spread.   Past Medical History:  Diagnosis Date   Ulcerative colitis ([place])     History reviewed. No pertinent surgical history.  History reviewed. No pertinent family history.  Social History   Socioeconomic History   Marital status: Married    Spouse name: Not on file   Number of children: Not on file   Years of education: Not on file   Highest education level: Not on file  Occupational History   Not on file  Tobacco Use   Smoking status: Never   Smokeless tobacco: Never  Substance and Sexual Activity   Alcohol use: No   Drug use: No   Sexual activity: Not on file  Other Topics Concern   Not on file  Social History Narrative   Not on file   Social Determinants of Health   Financial Resource Strain: Not on file  Food Insecurity: Not on file  Transportation Needs: Not on file  Physical Activity: Not on file  Stress: Not on file  Social Connections: Not on file  Intimate Partner Violence: Not on file    Outpatient Medications Prior to Visit  Medication Sig Dispense Refill   cetirizine (ZYRTEC) 10 MG tablet Take 1 tablet (10 mg total) by mouth daily. 30 tablet 11   fluticasone (FLONASE) 50 MCG/ACT nasal spray Place 2 sprays into both nostrils daily. 16 g 6   folic acid (FOLVITE) 1 MG tablet Take 1 mg by mouth daily.     inFLIXimab (REMICADE) 100 MG injection Inject into the vein. Every 2 months     methotrexate (RHEUMATREX) 2.5 MG tablet Take by mouth. 10 tablets once a week     omeprazole (PRILOSEC) 40 MG  capsule Take 1 capsule (40 mg total) by mouth daily. (Needs to be seen before next refill) 30 capsule 0   clotrimazole-betamethasone (LOTRISONE) cream APPLY TWICE DAILY (Patient not taking: Reported on 10/09/2021) 30 g 0   No facility-administered medications prior to visit.    No Known Allergies  Review of Systems As per HPI.     Objective:    Physical Exam Vitals and nursing note reviewed.  Constitutional:      General: He is not in acute distress.    Appearance: He is not ill-appearing, toxic-appearing or diaphoretic.  Pulmonary:     Effort: Pulmonary effort is normal. No respiratory distress.  Skin:    General: Skin is warm and dry.     Findings: Rash (upper right thigh, consistent with tinea.) present.  Neurological:     Mental Status: He is alert and oriented to person, place, and time.  Psychiatric:        Mood and Affect: Mood normal.        Behavior: Behavior normal.    BP 121/73   Pulse 69   Temp (!) 96 F (35.6 C) (Temporal)   Resp 20   Ht _0  (1.702 m)   Wt 178 lb (80.7 kg)  SpO2 99%   BMI 27.88 kg/m  Wt Readings from Last 3 Encounters:  10/09/21 178 lb (80.7 kg)  06/18/21 174 lb 12.8 oz (79.3 kg)  12/30/18 176 lb 6.4 oz (80 kg)    Health Maintenance Due  Topic Date Due   HIV Screening  Never done   Hepatitis C Screening  Never done   TETANUS/TDAP  Never done   Zoster Vaccines- Shingrix (1 of 2) Never done   COLONOSCOPY (Pts 45-44yr Insurance coverage will need to be confirmed)  Never done   Pneumococcal Vaccine 169614Years old (3 - PPSV23 if available, else PCV20) 07/03/2019    There are no preventive care reminders to display for this patient.   No results found for: TSH Lab Results  Component Value Date   WBC 7.0 09/30/2018   HGB 16.3 09/30/2018   HCT 46.7 09/30/2018   MCV 89 09/30/2018   PLT 238 09/30/2018   No results found for: NA, K, CHLORIDE, CO2, GLUCOSE, BUN, CREATININE, BILITOT, ALKPHOS, AST, ALT, PROT, ALBUMIN, CALCIUM,  ANIONGAP, EGFR, GFR No results found for: CHOL No results found for: HDL No results found for: LDLCALC No results found for: TRIG No results found for: CHOLHDL No results found for: HGBA1C     Assessment & Plan:   GAustanwas seen today for lesion on leg.  Diagnoses and all orders for this visit:  Jock itch Improved with lotrisone but did not clear. Will try diflucan as below. Handout given.  -     fluconazole (DIFLUCAN) 150 MG tablet; 1 po q week x 4 weeks -     clotrimazole-betamethasone (LOTRISONE) cream; Apply topically 2 (two) times daily.  Return to office for new or worsening symptoms, or if symptoms persist.   The patient indicates understanding of these issues and agrees with the plan.  TGwenlyn Perking FNP

## 2022-02-22 ENCOUNTER — Ambulatory Visit: Payer: Commercial Managed Care - PPO | Admitting: Family

## 2022-02-22 ENCOUNTER — Encounter: Payer: Self-pay | Admitting: Family

## 2022-02-22 VITALS — BP 130/75 | HR 66 | Temp 97.7°F | Ht 67.0 in | Wt 179.0 lb

## 2022-02-22 DIAGNOSIS — B354 Tinea corporis: Secondary | ICD-10-CM | POA: Diagnosis not present

## 2022-02-22 MED ORDER — FLUCONAZOLE 150 MG PO TABS: 150.0000 mg | ORAL_TABLET | Freq: Every day | ORAL | 0 refills | Status: DC

## 2022-02-22 MED ORDER — NYSTATIN 100000 UNIT/GM EX POWD: 1.0000 "application " | Freq: Three times a day (TID) | CUTANEOUS | 0 refills | Status: DC

## 2022-02-22 MED ORDER — KETOCONAZOLE 2 % EX CREA: 1.0000 "application " | TOPICAL_CREAM | Freq: Every day | CUTANEOUS | 0 refills | Status: DC

## 2022-02-22 NOTE — Patient Instructions (Signed)
Tiña corporal °Body Ringworm °La tiña corporal es una infección de la piel que suele causar una erupción en forma de anillos. A la tiña corporal también se la conoce como tinea corporis. °Puede afectar cualquier zona de la piel. Esta afección se transmite fácilmente de una persona a la otra (es muy contagiosa). °¿Cuáles son las causas? °Esta afección es causada por hongos llamados dermatofitos. Se manifiesta cuando estos hongos crecen sin control en la piel. °Es posible contagiarse la afección al tocar a una persona o un animal que la tiene. También puede contagiarse si comparte objetos con una persona o una mascota infectada. Estos incluyen los siguientes: °Ropa, ropa de cama y toallas. °Cepillos o peines. °Equipo de gimnasio. °Cualquier otro objeto que contenga el hongo. °¿Qué incrementa el riesgo? °Es más probable que tengan esta afección las personas que: °Practican deportes que implican contacto físico, como la lucha. °[place] mucho. °Viven en zonas de calor y humedad. °Usan duchas públicas. °Tienen el sistema inmunitario debilitado. °¿Cuáles son los signos o los síntomas? °Los síntomas de esta afección incluyen: °Manchas y bultos rojos elevados que causan picazón. °Manchas rojas escamosas. °Erupción en forma de anillos. La erupción cutánea puede consistir en lo siguiente: °Un centro transparente. °Escamas o bultos rojos en el medio. °Enrojecimiento cerca de los bordes. °Piel seca y escamosa dentro o alrededor de ella. °¿Cómo se diagnostica? °Generalmente, esta afección puede diagnosticarse con un examen de la piel. Se puede tomar una muestra de la piel de la zona afectada y examinarla con un microscopio para determinar si hay hongos. °¿Cómo se trata? °El tratamiento de esta afección puede incluir: °Una crema o una pomada antimicótica. °Un champú antimicótico. °Medicamentos antimicóticos. Estos pueden recetarse si la tiña: °Es intensa. °Es recurrente. °Dura mucho tiempo. °Siga estas instrucciones en su  casa: °Tome los medicamentos de venta libre y los recetados solamente como se lo haya indicado el médico. °Si le indicaron una crema o una pomada antimicótica: °Úsela como se lo haya indicado el médico. °Lave el área de la infección y séquela bien antes de aplicar la crema o la pomada. °Si le indicaron un champú antimicótico: °Úsela como se lo haya indicado el médico. °Deje actuar el champú en el cuerpo durante 3 a 5 minutos antes de enjuagarlo. °Mientras tiene la erupción: °Use ropa suelta para evitar roces e irritación. °Lave o cambie las sábanas cada noche. °Desinfecte o deseche los objetos que puedan estar infectados. °Lave la ropa y las sábanas con agua caliente. °Lávese las manos frecuentemente con agua y jabón. Use desinfectante para manos si no dispone de agua y jabón. °Si su mascota tiene la misma infección, llévela al veterinario para que reciba tratamiento. °¿Cómo se evita? °Tome un baño o una ducha todos los días y después de hacer ejercicio o practicar deportes. °Seque la piel completamente después de bañarse. °Use sandalias o zapatos en lugares públicos y duchas. °Cámbiese la ropa todos los días. °Lave la ropa deportiva después de cada uso. °No comparta los artículos de uso personal con otras personas. °Evite tocar las manchas rojas de piel de otras personas. °No toque las mascotas que tienen zonas sin pelos. °Si lo hace, lávese las manos. °Comuníquese con un médico si: °La erupción continúa diseminándose después de 7 días de tratamiento. °No se cura en el término de 4 semanas. °El área alrededor de la erupción se vuelve roja, está caliente al tacto, le duele o se hincha. °Resumen °La tiña corporal es una infección de la piel que suele causar una erupción en   forma de anillos. °Esta afección se transmite fácilmente de una persona a la otra (es muy contagiosa). °Esta afección puede tratarse con cremas o pomadas antimicóticas, champú antimicótico o medicamentos antimicóticos. °Tome los medicamentos de  venta libre y los recetados solamente como se lo haya indicado el médico. °Esta información no tiene como fin reemplazar el consejo del médico. Asegúrese de hacerle al médico cualquier pregunta que tenga. °Document Revised: 08/11/2018 Document Reviewed: 08/11/2018 °Elsevier Patient Education © 2022 [employer]vier Inc. ° °

## 2022-02-22 NOTE — Progress Notes (Signed)
? ?Subjective:  ? ? Patient ID: Christopher Haas, male    DOB: 10/05/1961, 61 y.o.   MRN: [id number] ? ?Chief Complaint  ?Patient presents with  ? Referral  ?  Abcess ?  ? ?PT presents to the office today for rash that has been on going in his groin. He has been treated for jock itch several times. He states the medication helps for 8 days then it returns. States this has been on going for 6+.  ? ?Pt is spanish speaking and interpreter used.  ?Rash ?This is a recurrent problem. The current episode started more than 1 month ago. The problem has been waxing and waning since onset. The affected locations include the groin. The rash is characterized by redness, blistering and swelling. He was exposed to nothing. Past treatments include anti-itch cream (antifugnal). The treatment provided mild relief.  ? ? ? ?Review of Systems  ?Skin:  Positive for rash.  ?All other systems reviewed and are negative. ? ?   ?Objective:  ? Physical Exam ?Vitals reviewed.  ?Constitutional:   ?   General: He is not in acute distress. ?   Appearance: He is well-developed.  ?HENT:  ?   Head: Normocephalic.  ?Eyes:  ?   General:     ?   Right eye: No discharge.     ?   Left eye: No discharge.  ?   Pupils: Pupils are equal, round, and reactive to light.  ?Neck:  ?   Thyroid: No thyromegaly.  ?Cardiovascular:  ?   Rate and Rhythm: Normal rate and regular rhythm.  ?   Heart sounds: Normal heart sounds. No murmur heard. ?Pulmonary:  ?   Effort: Pulmonary effort is normal. No respiratory distress.  ?   Breath sounds: Normal breath sounds. No wheezing.  ?Abdominal:  ?   General: Bowel sounds are normal. There is no distension.  ?   Palpations: Abdomen is soft.  ?   Tenderness: There is no abdominal tenderness.  ?Genitourinary: ? ? ?   Comments: Circular rash bilateral with mild discoloration in center  ?Musculoskeletal:     ?   General: No tenderness. Normal range of motion.  ?   Cervical back: Normal range of motion and neck supple.  ?Skin: ?    General: Skin is warm and dry.  ?   Findings: Rash present. No erythema.  ?Neurological:  ?   Mental Status: He is alert and oriented to person, place, and time.  ?   Cranial Nerves: No cranial nerve deficit.  ?   Deep Tendon Reflexes: Reflexes are normal and symmetric.  ?Psychiatric:     ?   Behavior: Behavior normal.     ?   Thought Content: Thought content normal.     ?   Judgment: Judgment normal.  ? ? ? ? ?BP 130/75   Pulse 66   Temp 97.7 ?F (36.5 ?C) (Temporal)   Ht 5\' 7"  (1.702 m)   Wt 179 lb (81.2 kg)   BMI 28.04 kg/m?  ? ?   ?Assessment & Plan:  ?Plumer Mittelstaedt comes in today with chief complaint of Referral (Abcess/) ? ? ?Diagnosis and orders addressed: ? ?1. Tinea corporis ?Keep clean and dry ?Avoid scratching ?Use ketoconazole daily ?Referral to Derm placed given symptoms have been on going for 6+ months  ?- ketoconazole (NIZORAL) 2 % cream; Apply 1 application. topically daily.  Dispense: 15 g; Refill: 0 ?- fluconazole (DIFLUCAN) 150 MG tablet; Take 1 tablet (150  mg total) by mouth daily.  Dispense: 7 tablet; Refill: 0 ?- nystatin (MYCOSTATIN/NYSTOP) powder; Apply 1 application. topically 3 (three) times daily.  Dispense: 15 g; Refill: 0 ?- Ambulatory referral to Dermatology ? ? ? ? ?Jannifer Rodney, FNP ? ? ? ?

## 2022-06-30 ENCOUNTER — Ambulatory Visit: Payer: Commercial Managed Care - PPO | Admitting: Family Medicine

## 2022-06-30 ENCOUNTER — Encounter: Payer: Self-pay | Admitting: Family Medicine

## 2022-06-30 VITALS — BP 148/70 | HR 81 | Temp 98.1°F | Resp 18 | Ht 67.0 in | Wt 180.0 lb

## 2022-06-30 DIAGNOSIS — R5381 Other malaise: Secondary | ICD-10-CM | POA: Diagnosis not present

## 2022-06-30 DIAGNOSIS — R5383 Other fatigue: Secondary | ICD-10-CM | POA: Diagnosis not present

## 2022-06-30 DIAGNOSIS — T675XXA Heat exhaustion, unspecified, initial encounter: Secondary | ICD-10-CM | POA: Diagnosis not present

## 2022-06-30 NOTE — Progress Notes (Signed)
Subjective:  Patient ID: Christopher Haas, male    DOB: 04/25/1961, 61 y.o.   MRN: [id number]  Patient Care Team: Dettinger, Fransisca Kaufmann, MD as PCP - General (Family Medicine)   Chief Complaint:  Fatigue   HPI: Christopher Haas is a 61 y.o. male presenting on 06/30/2022 for Fatigue   Pt presents today for evaluation of fatigue and malaise. States he feels like he got overheated on Saturday while working outside. States when working he started feeling hot and weak and then developed a slight headache. States after he started feeling funny he went inside to cool down and drank some gatorade. States he was feeling some better but reports over the last 2 days he has felt tired and like he has a fever at times. No other reported symptoms. No chest pain, shortness of breath, dizziness, confusion, syncope, palpitations, or focal neurological deficits. States urine output is normal.    Relevant past medical, surgical, family, and social history reviewed and updated as indicated.  Allergies and medications reviewed and updated. Data reviewed: Chart in Epic.   Past Medical History:  Diagnosis Date   Ulcerative colitis ([place])     History reviewed. No pertinent surgical history.  Social History   Socioeconomic History   Marital status: Married    Spouse name: Not on file   Number of children: Not on file   Years of education: Not on file   Highest education level: Not on file  Occupational History   Not on file  Tobacco Use   Smoking status: Never   Smokeless tobacco: Never  Substance and Sexual Activity   Alcohol use: No   Drug use: No   Sexual activity: Not on file  Other Topics Concern   Not on file  Social History Narrative   Not on file   Social Determinants of Health   Financial Resource Strain: Not on file  Food Insecurity: Not on file  Transportation Needs: Not on file  Physical Activity: Not on file  Stress: Not on file  Social Connections: Not on file  Intimate  Partner Violence: Not on file    Outpatient Encounter Medications as of 06/30/2022  Medication Sig   cetirizine (ZYRTEC) 10 MG tablet Take 1 tablet (10 mg total) by mouth daily.   clotrimazole-betamethasone (LOTRISONE) cream Apply topically 2 (two) times daily.   fluconazole (DIFLUCAN) 150 MG tablet Take 1 tablet (150 mg total) by mouth daily.   fluticasone (FLONASE) 50 MCG/ACT nasal spray Place 2 sprays into both nostrils daily.   folic acid (FOLVITE) 1 MG tablet Take 1 mg by mouth daily.   inFLIXimab (REMICADE) 100 MG injection Inject into the vein. Every 2 months   ketoconazole (NIZORAL) 2 % cream Apply 1 application. topically daily.   methotrexate (RHEUMATREX) 2.5 MG tablet Take by mouth. 10 tablets once a week   methotrexate (RHEUMATREX) 2.5 MG tablet $RemoveB'25mg'[id number]$  by mouth once weekly   nystatin (MYCOSTATIN/NYSTOP) powder Apply 1 application. topically 3 (three) times daily.   omeprazole (PRILOSEC) 40 MG capsule Take 1 capsule (40 mg total) by mouth daily. (Needs to be seen before next refill)   No facility-administered encounter medications on file as of 06/30/2022.    No Known Allergies  Review of Systems  Constitutional:  Positive for fatigue. Negative for activity change, appetite change, chills, diaphoresis, fever and unexpected weight change.  HENT: Negative.    Eyes: Negative.   Respiratory:  Negative for cough, chest tightness and shortness of breath.  Cardiovascular:  Negative for chest pain, palpitations and leg swelling.  Gastrointestinal:  Negative for abdominal pain, blood in stool, constipation, diarrhea, nausea and vomiting.  Endocrine: Negative.   Genitourinary:  Negative for decreased urine volume, difficulty urinating, dysuria, frequency and urgency.  Musculoskeletal:  Negative for arthralgias and myalgias.  Skin: Negative.   Allergic/Immunologic: Negative.   Neurological:  Positive for weakness and headaches. Negative for dizziness, tremors, seizures, syncope, facial  asymmetry, speech difficulty, light-headedness and numbness.  Hematological: Negative.   Psychiatric/Behavioral:  Negative for confusion, hallucinations, sleep disturbance and suicidal ideas.   All other systems reviewed and are negative.       Objective:  BP (!) 148/70   Pulse 81   Temp 98.1 F (36.7 C)   Resp 18   Ht $R'5\' 7"'Gu$  (1.702 m)   Wt 180 lb (81.6 kg)   SpO2 98%   BMI 28.19 kg/m    Wt Readings from Last 3 Encounters:  06/30/22 180 lb (81.6 kg)  02/22/22 179 lb (81.2 kg)  10/09/21 178 lb (80.7 kg)    Physical Exam Vitals and nursing note reviewed.  Constitutional:      General: He is not in acute distress.    Appearance: Normal appearance. He is well-developed and well-groomed. He is not ill-appearing, toxic-appearing or diaphoretic.  HENT:     Head: Normocephalic and atraumatic.     Jaw: There is normal jaw occlusion.     Right Ear: Hearing, tympanic membrane, ear canal and external ear normal.     Left Ear: Hearing, tympanic membrane, ear canal and external ear normal.     Nose: Nose normal.     Mouth/Throat:     Lips: Pink.     Mouth: Mucous membranes are moist.     Pharynx: Oropharynx is clear. Uvula midline.  Eyes:     General: Lids are normal.     Conjunctiva/sclera: Conjunctivae normal.     Pupils: Pupils are equal, round, and reactive to light.  Neck:     Thyroid: No thyroid mass, thyromegaly or thyroid tenderness.     Vascular: No carotid bruit or JVD.     Trachea: Trachea and phonation normal.  Cardiovascular:     Rate and Rhythm: Normal rate and regular rhythm.     Chest Wall: PMI is not displaced.     Pulses: Normal pulses.     Heart sounds: Normal heart sounds. No murmur heard.    No friction rub. No gallop.  Pulmonary:     Effort: Pulmonary effort is normal. No respiratory distress.     Breath sounds: Normal breath sounds. No wheezing.  Abdominal:     General: Bowel sounds are normal. There is no distension or abdominal bruit.      Palpations: Abdomen is soft. There is no hepatomegaly or splenomegaly.     Tenderness: There is no abdominal tenderness. There is no right CVA tenderness or left CVA tenderness.     Hernia: No hernia is present.  Musculoskeletal:        General: Normal range of motion.     Cervical back: Normal range of motion and neck supple.     Right lower leg: No edema.     Left lower leg: No edema.  Lymphadenopathy:     Cervical: No cervical adenopathy.  Skin:    General: Skin is warm and dry.     Capillary Refill: Capillary refill takes less than 2 seconds.     Coloration: Skin is not cyanotic,  jaundiced or pale.     Findings: No rash.  Neurological:     General: No focal deficit present.     Mental Status: He is alert and oriented to person, place, and time.     Cranial Nerves: No cranial nerve deficit.     Sensory: Sensation is intact. No sensory deficit.     Motor: Motor function is intact. No weakness.     Coordination: Coordination is intact. Coordination normal.     Gait: Gait is intact. Gait normal.     Deep Tendon Reflexes: Reflexes are normal and symmetric. Reflexes normal.  Psychiatric:        Attention and Perception: Attention and perception normal.        Mood and Affect: Mood and affect normal.        Speech: Speech normal.        Behavior: Behavior normal. Behavior is cooperative.        Thought Content: Thought content normal.        Cognition and Memory: Cognition and memory normal.        Judgment: Judgment normal.     Results for orders placed or performed in visit on 09/30/18  Urine Culture   Specimen: Urine   URINE  Result Value Ref Range   Urine Culture, Routine Final report    Organism ID, Bacteria No growth   CBC with Differential/Platelet  Result Value Ref Range   WBC 7.0 3.4 - 10.8 x10E3/uL   RBC 5.23 4.14 - 5.80 x10E6/uL   Hemoglobin 16.3 13.0 - 17.7 g/dL   Hematocrit 46.7 37.5 - 51.0 %   MCV 89 79 - 97 fL   MCH 31.2 26.6 - 33.0 pg   MCHC 34.9 31.5 -  35.7 g/dL   RDW 13.4 12.3 - 15.4 %   Platelets 238 150 - 450 x10E3/uL   Neutrophils 39 Not Estab. %   Lymphs 48 Not Estab. %   Monocytes 6 Not Estab. %   Eos 6 Not Estab. %   Basos 1 Not Estab. %   Neutrophils Absolute 2.8 1.4 - 7.0 x10E3/uL   Lymphocytes Absolute 3.3 (H) 0.7 - 3.1 x10E3/uL   Monocytes Absolute 0.4 0.1 - 0.9 x10E3/uL   EOS (ABSOLUTE) 0.4 0.0 - 0.4 x10E3/uL   Basophils Absolute 0.1 0.0 - 0.2 x10E3/uL   Immature Granulocytes 0 Not Estab. %   Immature Grans (Abs) 0.0 0.0 - 0.1 x10E3/uL  Urinalysis  Result Value Ref Range   Specific Gravity, UA 1.020 1.005 - 1.030   pH, UA 7.5 5.0 - 7.5   Color, UA Yellow Yellow   Appearance Ur Clear Clear   Leukocytes, UA Negative Negative   Protein, UA Negative Negative/Trace   Glucose, UA Negative Negative   Ketones, UA Negative Negative   RBC, UA Negative Negative   Bilirubin, UA Negative Negative   Urobilinogen, Ur 0.2 0.2 - 1.0 mg/dL   Nitrite, UA Negative Negative       Pertinent labs & imaging results that were available during my care of the patient were reviewed by me and considered in my medical decision making.  Assessment & Plan:  Fabricio was seen today for fatigue.  Diagnoses and all orders for this visit:  Malaise and fatigue Well hydrated in office. Discussed adequate hydration in detail. Aware to report new, worsening, or continued symptoms. Will obtain labs to evaluate for dehydration, anemia, or electrolyte imbalances. Due to new reports of malaise and fatigue, will swab for COVID and  influenza.  -     CMP14+EGFR -     CBC with Differential/Platelet -     COVID-19, Flu A+B and RSV  Heat exhaustion, initial encounter Well hydrated in office. Discussed adequate hydration in detail. Aware to report new, worsening, or continued symptoms. Will obtain labs to evaluate for dehydration, anemia, or electrolyte imbalances.  -     CMP14+EGFR -     CBC with Differential/Platelet     Continue all other  maintenance medications.  Follow up plan: Return if symptoms worsen or fail to improve.   Continue healthy lifestyle choices, including diet (rich in fruits, vegetables, and lean proteins, and low in salt and simple carbohydrates) and exercise (at least 30 minutes of moderate physical activity daily).  Educational handout given for heat exhaustion   The above assessment and management plan was discussed with the patient. The patient verbalized understanding of and has agreed to the management plan. Patient is aware to call the clinic if they develop any new symptoms or if symptoms persist or worsen. Patient is aware when to return to the clinic for a follow-up visit. Patient educated on when it is appropriate to go to the emergency department.   Monia Pouch, FNP-C [facility] [phone]

## 2022-07-01 LAB — CMP14+EGFR
ALT: 63 IU/L — ABNORMAL HIGH (ref 0–44)
AST: 41 IU/L — ABNORMAL HIGH (ref 0–40)
Albumin/Globulin Ratio: 1.5 (ref 1.2–2.2)
Albumin: 4.4 g/dL (ref 3.8–4.9)
Alkaline Phosphatase: 76 IU/L (ref 44–121)
BUN/Creatinine Ratio: 18 (ref 10–24)
BUN: 16 mg/dL (ref 8–27)
Bilirubin Total: 0.3 mg/dL (ref 0.0–1.2)
CO2: 22 mmol/L (ref 20–29)
Calcium: 9.3 mg/dL (ref 8.6–10.2)
Chloride: 99 mmol/L (ref 96–106)
Creatinine, Ser: 0.91 mg/dL (ref 0.76–1.27)
Globulin, Total: 2.9 g/dL (ref 1.5–4.5)
Glucose: 141 mg/dL — ABNORMAL HIGH (ref 70–99)
Potassium: 4 mmol/L (ref 3.5–5.2)
Sodium: 137 mmol/L (ref 134–144)
Total Protein: 7.3 g/dL (ref 6.0–8.5)
eGFR: 96 mL/min/{1.73_m2} (ref >59)

## 2022-07-01 LAB — CBC WITH DIFFERENTIAL/PLATELET
Basophils Absolute: 0.1 10*3/uL (ref 0.0–0.2)
Basos: 1 %
EOS (ABSOLUTE): 0.3 10*3/uL (ref 0.0–0.4)
Eos: 4 %
Hematocrit: 43.6 % (ref 37.5–51.0)
Hemoglobin: 15 g/dL (ref 13.0–17.7)
Immature Grans (Abs): 0 10*3/uL (ref 0.0–0.1)
Immature Granulocytes: 0 %
Lymphocytes Absolute: 3.7 10*3/uL — ABNORMAL HIGH (ref 0.7–3.1)
Lymphs: 51 %
MCH: 31.4 pg (ref 26.6–33.0)
MCHC: 34.4 g/dL (ref 31.5–35.7)
MCV: 91 fL (ref 79–97)
Monocytes Absolute: 1.3 10*3/uL — ABNORMAL HIGH (ref 0.1–0.9)
Monocytes: 18 %
Neutrophils Absolute: 1.8 10*3/uL (ref 1.4–7.0)
Neutrophils: 26 %
Platelets: 172 10*3/uL (ref 150–450)
RBC: 4.78 x10E6/uL (ref 4.14–5.80)
RDW: 13.1 % (ref 11.6–15.4)
WBC: 7.1 10*3/uL (ref 3.4–10.8)

## 2022-07-01 LAB — COVID-19, FLU A+B AND RSV
Influenza A, NAA: NOT DETECTED
Influenza B, NAA: NOT DETECTED
RSV, NAA: NOT DETECTED
SARS-CoV-2, NAA: NOT DETECTED

## 2022-08-06 ENCOUNTER — Encounter: Payer: Self-pay | Admitting: Family Medicine

## 2022-08-06 ENCOUNTER — Ambulatory Visit: Payer: Commercial Managed Care - PPO | Admitting: Family Medicine

## 2022-08-06 VITALS — BP 129/70 | HR 86 | Temp 98.0°F | Ht 67.0 in | Wt 181.0 lb

## 2022-08-06 DIAGNOSIS — T675XXD Heat exhaustion, unspecified, subsequent encounter: Secondary | ICD-10-CM | POA: Diagnosis not present

## 2022-08-06 DIAGNOSIS — R748 Abnormal levels of other serum enzymes: Secondary | ICD-10-CM

## 2022-08-06 DIAGNOSIS — Z23 Encounter for immunization: Secondary | ICD-10-CM

## 2022-08-06 NOTE — Progress Notes (Signed)
BP 129/70   Pulse 86   Temp 98 F (36.7 C)   Ht 5\' 7"  (1.702 m)   Wt 181 lb (82.1 kg)   SpO2 96%   BMI 28.35 kg/m    Subjective:   Patient ID: Christopher Haas, male    DOB: Oct 29, 1961, 61 y.o.   MRN: [id number]  HPI: Christopher Haas is a 61 y.o. male presenting on 08/06/2022 for Fatigue (4-6 week follow up- better per pt)   HPI Heat exhaustion follow-up Patient is coming in today for heat exhaustion follow-up and fatigue and says that he is feeling a lot better.  Sometimes when he gets out in the sun he does still feel a little bit overheated but it passes a lot quicker and has been happening less and he is feeling a lot better with it.  His liver function was up on the labs.  Relevant past medical, surgical, family and social history reviewed and updated as indicated. Interim medical history since our last visit reviewed. Allergies and medications reviewed and updated.  Review of Systems  Constitutional:  Negative for chills and fever.  Eyes:  Negative for visual disturbance.  Respiratory:  Negative for shortness of breath and wheezing.   Cardiovascular:  Negative for chest pain and leg swelling.  Musculoskeletal:  Negative for back pain and gait problem.  Skin:  Negative for rash.  Neurological:  Negative for dizziness, weakness and light-headedness.  All other systems reviewed and are negative.   Per HPI unless specifically indicated above   Allergies as of 08/06/2022   No Known Allergies      Medication List        Accurate as of August 06, 2022  4:26 PM. If you have any questions, ask your nurse or doctor.          cetirizine 10 MG tablet Commonly known as: ZYRTEC Take 1 tablet (10 mg total) by mouth daily.   clotrimazole-betamethasone cream Commonly known as: LOTRISONE Apply topically 2 (two) times daily.   fluconazole 150 MG tablet Commonly known as: DIFLUCAN Take 1 tablet (150 mg total) by mouth daily.   fluticasone 50 MCG/ACT nasal  spray Commonly known as: FLONASE Place 2 sprays into both nostrils daily.   folic acid 1 MG tablet Commonly known as: FOLVITE Take 1 mg by mouth daily.   inFLIXimab 100 MG injection Commonly known as: REMICADE Inject into the vein. Every 2 months   ketoconazole 2 % cream Commonly known as: NIZORAL Apply 1 application. topically daily.   methotrexate 2.5 MG tablet Commonly known as: RHEUMATREX 25mg  by mouth once weekly   methotrexate 2.5 MG tablet Commonly known as: RHEUMATREX Take by mouth. 10 tablets once a week   nystatin powder Commonly known as: MYCOSTATIN/NYSTOP Apply 1 application. topically 3 (three) times daily.   omeprazole 40 MG capsule Commonly known as: PRILOSEC Take 1 capsule (40 mg total) by mouth daily. (Needs to be seen before next refill)         Objective:   BP 129/70   Pulse 86   Temp 98 F (36.7 C)   Ht 5\' 7"  (1.702 m)   Wt 181 lb (82.1 kg)   SpO2 96%   BMI 28.35 kg/m   Wt Readings from Last 3 Encounters:  08/06/22 181 lb (82.1 kg)  06/30/22 180 lb (81.6 kg)  02/22/22 179 lb (81.2 kg)    Physical Exam Vitals and nursing note reviewed.  Constitutional:      General: He is  not in acute distress.    Appearance: He is well-developed. He is not diaphoretic.  Eyes:     General: No scleral icterus.    Conjunctiva/sclera: Conjunctivae normal.  Neck:     Thyroid: No thyromegaly.  Cardiovascular:     Rate and Rhythm: Normal rate and regular rhythm.     Heart sounds: Normal heart sounds. No murmur heard. Pulmonary:     Effort: Pulmonary effort is normal. No respiratory distress.     Breath sounds: Normal breath sounds. No wheezing.  Musculoskeletal:        General: Normal range of motion.     Cervical back: Neck supple.  Lymphadenopathy:     Cervical: No cervical adenopathy.  Skin:    General: Skin is warm and dry.     Findings: No rash.  Neurological:     Mental Status: He is alert and oriented to person, place, and time.      Coordination: Coordination normal.  Psychiatric:        Behavior: Behavior normal.       Assessment & Plan:   Problem List Items Addressed This Visit   None Visit Diagnoses     Heat exhaustion, subsequent encounter    -  Primary   Need for immunization against influenza       Relevant Orders   Flu Vaccine QUAD 11mo+IM (Fluarix, Fluzone & Alfiuria Quad PF) (Completed)   Elevated liver enzymes           Patient is feeling a lot better, does feel like he had heat exhaustion, we will recheck his liver numbers because they were up slightly but other than that his blood work looks good.  He is trying to stay more hydrated. Follow up plan: Return in about 3 months (around 11/05/2022), or if symptoms worsen or fail to improve, for Physical exam.  Counseling provided for all of the vaccine components Orders Placed This Encounter  Procedures   Flu Vaccine QUAD 31mo+IM (Fluarix, Fluzone & Alfiuria Quad PF)    Arville Care, MD [facility] 08/06/2022, 4:26 PM

## 2022-08-06 NOTE — Addendum Note (Signed)
Addended by: Dorene Sorrow on: 08/06/2022 04:41 PM   Modules accepted: Orders

## 2022-08-07 LAB — CBC WITH DIFFERENTIAL/PLATELET
Basophils Absolute: 0.1 10*3/uL (ref 0.0–0.2)
Basos: 1 %
EOS (ABSOLUTE): 0.5 10*3/uL — ABNORMAL HIGH (ref 0.0–0.4)
Eos: 6 %
Hematocrit: 43.6 % (ref 37.5–51.0)
Hemoglobin: 14.8 g/dL (ref 13.0–17.7)
Immature Grans (Abs): 0 10*3/uL (ref 0.0–0.1)
Immature Granulocytes: 0 %
Lymphocytes Absolute: 4.2 10*3/uL — ABNORMAL HIGH (ref 0.7–3.1)
Lymphs: 47 %
MCH: 31 pg (ref 26.6–33.0)
MCHC: 33.9 g/dL (ref 31.5–35.7)
MCV: 91 fL (ref 79–97)
Monocytes Absolute: 0.8 10*3/uL (ref 0.1–0.9)
Monocytes: 9 %
Neutrophils Absolute: 3.4 10*3/uL (ref 1.4–7.0)
Neutrophils: 37 %
Platelets: 215 10*3/uL (ref 150–450)
RBC: 4.78 x10E6/uL (ref 4.14–5.80)
RDW: 13.4 % (ref 11.6–15.4)
WBC: 9 10*3/uL (ref 3.4–10.8)

## 2022-08-07 LAB — CMP14+EGFR
ALT: 50 IU/L — ABNORMAL HIGH (ref 0–44)
AST: 31 IU/L (ref 0–40)
Albumin/Globulin Ratio: 1.4 (ref 1.2–2.2)
Albumin: 4.3 g/dL (ref 3.8–4.9)
Alkaline Phosphatase: 106 IU/L (ref 44–121)
BUN/Creatinine Ratio: 17 (ref 10–24)
BUN: 15 mg/dL (ref 8–27)
Bilirubin Total: <0.2 mg/dL (ref 0.0–1.2)
CO2: 24 mmol/L (ref 20–29)
Calcium: 9.5 mg/dL (ref 8.6–10.2)
Chloride: 101 mmol/L (ref 96–106)
Creatinine, Ser: 0.86 mg/dL (ref 0.76–1.27)
Globulin, Total: 3.1 g/dL (ref 1.5–4.5)
Glucose: 134 mg/dL — ABNORMAL HIGH (ref 70–99)
Potassium: 3.9 mmol/L (ref 3.5–5.2)
Sodium: 138 mmol/L (ref 134–144)
Total Protein: 7.4 g/dL (ref 6.0–8.5)
eGFR: 99 mL/min/{1.73_m2} (ref >59)

## 2022-08-12 ENCOUNTER — Telehealth: Payer: Self-pay | Admitting: Family Medicine

## 2022-08-12 NOTE — Telephone Encounter (Signed)
Made aware that labs are good and that Dr. Warrick Parisian is not concerned about anything.

## 2022-11-05 ENCOUNTER — Ambulatory Visit (INDEPENDENT_AMBULATORY_CARE_PROVIDER_SITE_OTHER): Payer: Commercial Managed Care - PPO | Admitting: Family Medicine

## 2022-11-05 ENCOUNTER — Encounter: Payer: Self-pay | Admitting: Family Medicine

## 2022-11-05 VITALS — BP 124/69 | HR 78 | Temp 97.5°F | Ht 67.0 in | Wt 178.0 lb

## 2022-11-05 DIAGNOSIS — Z0001 Encounter for general adult medical examination with abnormal findings: Secondary | ICD-10-CM

## 2022-11-05 DIAGNOSIS — H1013 Acute atopic conjunctivitis, bilateral: Secondary | ICD-10-CM | POA: Diagnosis not present

## 2022-11-05 DIAGNOSIS — R739 Hyperglycemia, unspecified: Secondary | ICD-10-CM | POA: Diagnosis not present

## 2022-11-05 DIAGNOSIS — R6889 Other general symptoms and signs: Secondary | ICD-10-CM | POA: Diagnosis not present

## 2022-11-05 DIAGNOSIS — Z0000 Encounter for general adult medical examination without abnormal findings: Secondary | ICD-10-CM

## 2022-11-05 LAB — BAYER DCA HB A1C WAIVED: HB A1C (BAYER DCA - WAIVED): 6.6 % — ABNORMAL HIGH (ref 4.8–5.6)

## 2022-11-05 MED ORDER — OLOPATADINE HCL 0.1 % OP SOLN: 1.0000 [drp] | Freq: Two times a day (BID) | OPHTHALMIC | 3 refills | Status: DC

## 2022-11-05 NOTE — Progress Notes (Signed)
BP 124/69   Pulse 78   Temp (!) 97.5 F (36.4 C)   Ht _0  (1.702 m)   Wt 178 lb (80.7 kg)   SpO2 97%   BMI 27.88 kg/m    Subjective:   Patient ID: Christopher Haas, male    DOB: [birth year], 61 y.o.   MRN: [id number]  HPI: Christopher Haas is a 61 y.o. male presenting on 11/05/2022 for Medical Management of Chronic Issues (CPE)   HPI Exam and physical Patient is coming in today for physical exam.  He says his biggest issue that has been having is he recently had 2 weeks ago some plaques present he was using and excellently got some in his eyes and his eyes have been very sensitive and irritated and red since then.  He says is not worsening but is not improving either.  He did try some over-the-counter eyedrops for moisture and they did not seem to be helping as much.  He still gets a flushing sensation especially when he is in any hotter areas especially when his wife runs that sheet I but is not all the time and is just sometimes.  He is trying to stay hydrated more.  Relevant past medical, surgical, family and social history reviewed and updated as indicated. Interim medical history since our last visit reviewed. Allergies and medications reviewed and updated.  Review of Systems  Constitutional:  Negative for chills and fever.  HENT:  Negative for ear pain and tinnitus.   Eyes:  Negative for pain.  Respiratory:  Negative for cough, shortness of breath and wheezing.   Cardiovascular:  Negative for chest pain, palpitations and leg swelling.  Gastrointestinal:  Negative for abdominal pain, blood in stool, constipation and diarrhea.  Genitourinary:  Negative for dysuria and hematuria.  Musculoskeletal:  Negative for back pain, gait problem and myalgias.  Skin:  Negative for rash.  Neurological:  Negative for dizziness, weakness and headaches.  Psychiatric/Behavioral:  Negative for suicidal ideas.   All other systems reviewed and are negative.   Per HPI unless specifically  indicated above   Allergies as of 11/05/2022   No Known Allergies      Medication List        Accurate as of November 05, 2022  3:17 PM. If you have any questions, ask your nurse or doctor.          STOP taking these medications    clotrimazole-betamethasone cream Commonly known as: LOTRISONE Stopped by: Fransisca Kaufmann Latissa Frick, MD   fluconazole 150 MG tablet Commonly known as: DIFLUCAN Stopped by: Worthy Rancher, MD   nystatin powder Commonly known as: MYCOSTATIN/NYSTOP Stopped by: Fransisca Kaufmann Emoni Yang, MD       TAKE these medications    cetirizine 10 MG tablet Commonly known as: ZYRTEC Take 1 tablet (10 mg total) by mouth daily.   fluticasone 50 MCG/ACT nasal spray Commonly known as: FLONASE Place 2 sprays into both nostrils daily.   folic acid 1 MG tablet Commonly known as: FOLVITE Take 1 mg by mouth daily.   inFLIXimab 100 MG injection Commonly known as: REMICADE Inject into the vein. Every 2 months   ketoconazole 2 % cream Commonly known as: NIZORAL Apply 1 application. topically daily.   methotrexate 2.5 MG tablet Commonly known as: RHEUMATREX 68m by mouth once weekly   methotrexate 2.5 MG tablet Commonly known as: RHEUMATREX Take by mouth. 10 tablets once a week   olopatadine 0.1 % ophthalmic solution Commonly known as: Patanol  Place 1 drop into both eyes 2 (two) times daily. Started by: Worthy Rancher, MD   omeprazole 40 MG capsule Commonly known as: PRILOSEC Take 1 capsule (40 mg total) by mouth daily. (Needs to be seen before next refill)         Objective:   BP 124/69   Pulse 78   Temp (!) 97.5 F (36.4 C)   Ht _0  (1.702 m)   Wt 178 lb (80.7 kg)   SpO2 97%   BMI 27.88 kg/m   Wt Readings from Last 3 Encounters:  11/05/22 178 lb (80.7 kg)  08/06/22 181 lb (82.1 kg)  06/30/22 180 lb (81.6 kg)    Physical Exam Vitals reviewed.  Constitutional:      General: He is not in acute distress.    Appearance: He is  well-developed. He is not diaphoretic.  HENT:     Right Ear: External ear normal.     Left Ear: External ear normal.     Nose: Nose normal.     Mouth/Throat:     Pharynx: No oropharyngeal exudate.  Eyes:     General: No scleral icterus.    Conjunctiva/sclera:     Right eye: Right conjunctiva is injected. No exudate or hemorrhage.    Left eye: Left conjunctiva is injected. No exudate or hemorrhage. Neck:     Thyroid: No thyromegaly.  Cardiovascular:     Rate and Rhythm: Normal rate and regular rhythm.     Heart sounds: Normal heart sounds. No murmur heard. Pulmonary:     Effort: Pulmonary effort is normal. No respiratory distress.     Breath sounds: Normal breath sounds. No wheezing.  Abdominal:     General: Bowel sounds are normal. There is no distension.     Palpations: Abdomen is soft.     Tenderness: There is no abdominal tenderness. There is no guarding or rebound.  Musculoskeletal:        General: No swelling. Normal range of motion.     Cervical back: Neck supple.  Lymphadenopathy:     Cervical: No cervical adenopathy.  Skin:    General: Skin is warm and dry.     Findings: No rash.  Neurological:     Mental Status: He is alert and oriented to person, place, and time.     Coordination: Coordination normal.  Psychiatric:        Behavior: Behavior normal.       Assessment & Plan:   Problem List Items Addressed This Visit   None Visit Diagnoses     Physical exam    -  Primary   Relevant Orders   Bayer DCA Hb A1c Waived   CBC with Differential/Platelet   CMP14+EGFR   Lipid panel   Elevated blood sugar       Relevant Orders   Bayer DCA Hb A1c Waived   CBC with Differential/Platelet   CMP14+EGFR   Lipid panel   Acute atopic conjunctivitis of both eyes       Relevant Medications   olopatadine (PATANOL) 0.1 % ophthalmic solution   Heat intolerance       Relevant Orders   Thyroid Panel With TSH     Will give drops for his eyes but if not improved then  recommended to go see an eye doctor.  Will check thyroid level along with other blood work to see what is going on with his feeling flushed every now and then.  Blood sugar was elevated last  time is here so we will check an A1c.  Follow up plan: Return in about 1 year (around 11/06/2023), or if symptoms worsen or fail to improve, for Physical exam.  Counseling provided for all of the vaccine components Orders Placed This Encounter  Procedures   Bayer [place] Hb A1c Waived   CBC with Differential/Platelet   CMP14+EGFR   Lipid panel   Thyroid Panel With TSH    Caryl Pina, MD [facility] 11/05/2022, 3:17 PM

## 2022-11-06 LAB — LIPID PANEL
Chol/HDL Ratio: 4.3 ratio (ref 0.0–5.0)
Cholesterol, Total: 154 mg/dL (ref 100–199)
HDL: 36 mg/dL — ABNORMAL LOW (ref >39)
LDL Chol Calc (NIH): 86 mg/dL (ref 0–99)
Triglycerides: 189 mg/dL — ABNORMAL HIGH (ref 0–149)
VLDL Cholesterol Cal: 32 mg/dL (ref 5–40)

## 2022-11-06 LAB — CBC WITH DIFFERENTIAL/PLATELET
Basophils Absolute: 0.1 10*3/uL (ref 0.0–0.2)
Basos: 1 %
EOS (ABSOLUTE): 0.6 10*3/uL — ABNORMAL HIGH (ref 0.0–0.4)
Eos: 7 %
Hematocrit: 46.5 % (ref 37.5–51.0)
Hemoglobin: 15.3 g/dL (ref 13.0–17.7)
Immature Grans (Abs): 0 10*3/uL (ref 0.0–0.1)
Immature Granulocytes: 0 %
Lymphocytes Absolute: 4.5 10*3/uL — ABNORMAL HIGH (ref 0.7–3.1)
Lymphs: 56 %
MCH: 29.8 pg (ref 26.6–33.0)
MCHC: 32.9 g/dL (ref 31.5–35.7)
MCV: 91 fL (ref 79–97)
Monocytes Absolute: 0.6 10*3/uL (ref 0.1–0.9)
Monocytes: 7 %
Neutrophils Absolute: 2.4 10*3/uL (ref 1.4–7.0)
Neutrophils: 29 %
Platelets: 206 10*3/uL (ref 150–450)
RBC: 5.13 x10E6/uL (ref 4.14–5.80)
RDW: 12.7 % (ref 11.6–15.4)
WBC: 8.3 10*3/uL (ref 3.4–10.8)

## 2022-11-06 LAB — CMP14+EGFR
ALT: 26 IU/L (ref 0–44)
AST: 21 IU/L (ref 0–40)
Albumin/Globulin Ratio: 1.6 (ref 1.2–2.2)
Albumin: 4.5 g/dL (ref 3.9–4.9)
Alkaline Phosphatase: 102 IU/L (ref 44–121)
BUN/Creatinine Ratio: 21 (ref 10–24)
BUN: 19 mg/dL (ref 8–27)
Bilirubin Total: 0.3 mg/dL (ref 0.0–1.2)
CO2: 22 mmol/L (ref 20–29)
Calcium: 9.9 mg/dL (ref 8.6–10.2)
Chloride: 101 mmol/L (ref 96–106)
Creatinine, Ser: 0.9 mg/dL (ref 0.76–1.27)
Globulin, Total: 2.9 g/dL (ref 1.5–4.5)
Glucose: 147 mg/dL — ABNORMAL HIGH (ref 70–99)
Potassium: 3.9 mmol/L (ref 3.5–5.2)
Sodium: 138 mmol/L (ref 134–144)
Total Protein: 7.4 g/dL (ref 6.0–8.5)
eGFR: 97 mL/min/{1.73_m2} (ref >59)

## 2022-11-06 LAB — THYROID PANEL WITH TSH
Free Thyroxine Index: 2.3 (ref 1.2–4.9)
T3 Uptake Ratio: 30 % (ref 24–39)
T4, Total: 7.5 ug/dL (ref 4.5–12.0)
TSH: 2.12 u[IU]/mL (ref 0.450–4.500)

## 2023-02-11 ENCOUNTER — Ambulatory Visit: Payer: Commercial Managed Care - PPO | Admitting: Family Medicine

## 2023-02-11 ENCOUNTER — Encounter: Payer: Self-pay | Admitting: Family Medicine

## 2023-02-11 VITALS — BP 122/65 | HR 67 | Ht 67.0 in | Wt 181.0 lb

## 2023-02-11 DIAGNOSIS — K2100 Gastro-esophageal reflux disease with esophagitis, without bleeding: Secondary | ICD-10-CM

## 2023-02-11 DIAGNOSIS — Z23 Encounter for immunization: Secondary | ICD-10-CM

## 2023-02-11 DIAGNOSIS — E1169 Type 2 diabetes mellitus with other specified complication: Secondary | ICD-10-CM | POA: Diagnosis not present

## 2023-02-11 MED ORDER — OMEPRAZOLE 40 MG PO CPDR: 40.0000 mg | DELAYED_RELEASE_CAPSULE | Freq: Every day | ORAL | 3 refills | Status: DC

## 2023-02-11 NOTE — Patient Instructions (Signed)
Diabetes mellitus y nutricin, en adultos Diabetes Mellitus and Nutrition, Adult Si sufre de diabetes, o diabetes mellitus, es muy importante tener hbitos alimenticios saludables debido a que sus niveles de azcar en la sangre (glucosa) se ven afectados en gran medida por lo que come y bebe. Comer alimentos saludables en las cantidades correctas, aproximadamente a la misma hora todos los das, lo ayudar a: Controlar su glucemia. Disminuir el riesgo de sufrir una enfermedad cardaca. Mejorar la presin arterial. Alcanzar o mantener un peso saludable. Qu puede afectar mi plan de alimentacin? Todas las personas que sufren de diabetes son diferentes y cada una tiene necesidades diferentes en cuanto a un plan de alimentacin. El mdico puede recomendarle que trabaje con un nutricionista para elaborar el mejor plan para usted. Su plan de alimentacin puede variar segn factores como: Las caloras que necesita. Los medicamentos que toma. Su peso. Sus niveles de glucemia, presin arterial y colesterol. Su nivel de actividad. Otras afecciones que tenga, como enfermedades cardacas o renales. Cmo me afectan los carbohidratos? Los carbohidratos, o hidratos de carbono, afectan su nivel de glucemia ms que cualquier otro tipo de alimento. La ingesta de carbohidratos aumenta la cantidad de glucosa en la sangre. Es importante conocer la cantidad de carbohidratos que se pueden ingerir en cada comida sin correr ningn riesgo. Esto es diferente en cada persona. Su nutricionista puede ayudarlo a calcular la cantidad de carbohidratos que debe ingerir en cada comida y en cada refrigerio. Cmo me afecta el alcohol? El alcohol puede provocar una disminucin de la glucemia (hipoglucemia), especialmente si usa insulina o toma determinados medicamentos por va oral para la diabetes. La hipoglucemia es una afeccin potencialmente mortal. Los sntomas de la hipoglucemia, como somnolencia, mareos y confusin, son  similares a los sntomas de haber consumido demasiado alcohol. No beba alcohol si: Su mdico le indica no hacerlo. Est embarazada, puede estar embarazada o est tratando de quedar embarazada. Si bebe alcohol: Limite la cantidad que bebe a lo siguiente: De 0 a 1 medida por da para las mujeres. De 0 a 2 medidas por da para los hombres. Sepa cunta cantidad de alcohol hay en las bebidas que toma. En los [place], una medida equivale a una botella de cerveza de 12 oz (355 ml), un vaso de vino de 5 oz (148 ml) o un vaso de una bebida alcohlica de alta graduacin de 1 oz (44 ml). Mantngase hidratado bebiendo agua, refrescos dietticos o t helado sin azcar. Tenga en cuenta que los refrescos comunes, los jugos y otras bebidas para mezclar pueden contener mucha azcar y se deben contar como carbohidratos. Consejos para seguir este plan  Leer las etiquetas de los alimentos Comience por leer el tamao de la porcin en la etiqueta de Informacin nutricional de los alimentos envasados y las bebidas. La cantidad de caloras, carbohidratos, grasas y otros nutrientes detallados en la etiqueta se basan en una porcin del alimento. Muchos alimentos contienen ms de una porcin por envase. Verifique la cantidad total de gramos (g) de carbohidratos totales en una porcin. Verifique la cantidad de gramos de grasas saturadas y grasas trans en una porcin. Escoja alimentos que no contengan estas grasas o que su contenido de estas sea bajo. Verifique la cantidad de miligramos (mg) de sal (sodio) en una porcin. La mayora de las personas deben limitar la ingesta de sodio total a menos de 2300 mg por da. Siempre consulte la informacin nutricional de los alimentos etiquetados como "con bajo contenido de grasa" o "sin grasa".   Estos alimentos pueden tener un mayor contenido de azcar agregada o carbohidratos refinados, y deben evitarse. Hable con su nutricionista para identificar sus objetivos diarios en  cuanto a los nutrientes mencionados en la etiqueta. Al ir de compras Evite comprar alimentos procesados, enlatados o precocidos. Estos alimentos tienden a tener una mayor cantidad de grasa, sodio y azcar agregada. Compre en la zona exterior de la tienda de comestibles. Esta es la zona donde se encuentran con mayor frecuencia las frutas y las verduras frescas, los cereales a granel, las carnes frescas y los productos lcteos frescos. Al cocinar Use mtodos de coccin a baja temperatura, como hornear, en lugar de mtodos de coccin a alta temperatura, como frer en abundante aceite. Cocine con aceites saludables, como el aceite de oliva, canola o girasol. Evite cocinar con manteca, crema o carnes con alto contenido de grasa. Planificacin de las comidas Coma las comidas y los refrigerios regularmente, preferentemente a la misma hora todos los das. Evite pasar largos perodos de tiempo sin comer. Consuma alimentos ricos en fibra, como frutas frescas, verduras, frijoles y cereales integrales. Consuma entre 4 y 6 onzas (entre 112 y 168 g) de protenas magras por da, como carnes magras, pollo, pescado, huevos o tofu. Una onza (oz) (28 g) de protena magra equivale a: 1 onza (28 g) de carne, pollo o pescado. 1 huevo.  taza (62 g) de tofu. Coma algunos alimentos por da que contengan grasas saludables, como aguacates, frutos secos, semillas y pescado. Qu alimentos debo comer? Frutas Bayas. Manzanas. Naranjas. Duraznos. Damascos. Ciruelas. Uvas. Mangos. Papayas. Granadas. Kiwi. Cerezas. Verduras Verduras de hoja verde, que incluyen lechuga, espinaca, col rizada, acelga, hojas de berza, hojas de mostaza y repollo. Remolachas. Coliflor. Brcoli. Zanahorias. Judas verdes. Tomates. Pimientos. Cebollas. Pepinos. Coles de Bruselas. Granos Granos integrales, como panes, galletas, tortillas, cereales y pastas de salvado o integrales. Avena sin azcar. Quinua. Arroz integral o salvaje. Carnes y otras  protenas Frutos de mar. Carne de ave sin piel. Cortes magros de ave y carne de res. Tofu. Frutos secos. Semillas. Lcteos Productos lcteos sin grasa o con bajo contenido de grasa, como leche, yogur y queso. Es posible que los productos detallados arriba no constituyan una lista completa de los alimentos y las bebidas que puede tomar. Consulte a un nutricionista para obtener ms informacin. Qu alimentos debo evitar? Frutas Frutas enlatadas al almbar. Verduras Verduras enlatadas. Verduras congeladas con mantequilla o salsa de crema. Granos Productos elaborados con harina y harina blanca refinada, como panes, pastas, bocadillos y cereales. Evite todos los alimentos procesados. Carnes y otras protenas Cortes de carne con alto contenido de grasa. Carne de ave con piel. Carnes empanizadas o fritas. Carne procesada. Evite las grasas saturadas. Lcteos Yogur, queso o leche enteros. Bebidas Bebidas azucaradas, como gaseosas o t helado. Es posible que los productos que se enumeran ms arriba no constituyan una lista completa de los alimentos y las bebidas que debe evitar. Consulte a un nutricionista para obtener ms informacin. Preguntas para hacerle al mdico Debo consultar con un especialista certificado en atencin y educacin sobre la diabetes? Es necesario que me rena con un nutricionista? A qu nmero puedo llamar si tengo preguntas? Cules son los mejores momentos para controlar la glucemia? Dnde encontrar ms informacin: American Diabetes Association (Asociacin Estadounidense de la Diabetes): diabetes.org Academy of Nutrition and Dietetics (Academia de Nutricin y Diettica): eatright.org National Institute of Diabetes and Digestive and Kidney Diseases (Instituto Nacional de la Diabetes y las Enfermedades Digestivas y Renales): niddk.nih.gov Association of Diabetes   Care & Education Specialists (Asociacin de Especialistas en Atencin y Educacin sobre la Diabetes):  diabeteseducator.org Resumen Es importante tener hbitos alimenticios saludables debido a que sus niveles de azcar en la sangre (glucosa) se ven afectados en gran medida por lo que come y bebe. Es importante consumir alcohol con prudencia. Un plan de comidas saludable lo ayudar a controlar la glucosa en sangre y a reducir el riesgo de enfermedades cardacas. El mdico puede recomendarle que trabaje con un nutricionista para elaborar el mejor plan para usted. Esta informacin no tiene como fin reemplazar el consejo del mdico. Asegrese de hacerle al mdico cualquier pregunta que tenga. Document Revised: 07/16/2020 Document Reviewed: 07/16/2020 Elsevier Patient Education  2023 Elsevier Inc.  

## 2023-02-11 NOTE — Progress Notes (Signed)
BP 122/65   Pulse 67   Ht 5\' 7"  (1.702 m)   Wt 181 lb (82.1 kg)   SpO2 99%   BMI 28.35 kg/m    Subjective:   Patient ID: Christopher Haas, male    DOB: [birth year], 62 y.o.   MRN: [id number]  HPI: Christopher Haas is a 62 y.o. male presenting on 02/11/2023 for Medical Management of Chronic Issues, Diabetes, and Gastroesophageal Reflux   HPI Type 2 diabetes mellitus Patient comes in today for recheck of his diabetes. Patient has been currently taking no medication, trying diet changes and activity. Patient is not currently on an ACE inhibitor/ARB. Patient has not seen an ophthalmologist this year. Patient denies any issues with their feet. The symptom started onset as an adult ulcerative colitis and GERD ARE RELATED TO DM   GERD Patient is currently on omeprazole.  She denies any major symptoms or abdominal pain or belching or burping. She denies any blood in her stool or lightheadedness or dizziness.   Ulcerative colitis Patient has ulcerative colitis and coming in for recheck.  He currently takes omeprazole.  He also sees gastroenterology  Relevant past medical, surgical, family and social history reviewed and updated as indicated. Interim medical history since our last visit reviewed. Allergies and medications reviewed and updated.  Review of Systems  Constitutional:  Negative for chills and fever.  Eyes:  Negative for visual disturbance.  Respiratory:  Negative for shortness of breath and wheezing.   Cardiovascular:  Negative for chest pain and leg swelling.  Musculoskeletal:  Negative for back pain and gait problem.  Skin:  Negative for rash.  Neurological:  Negative for dizziness, weakness and light-headedness.  All other systems reviewed and are negative.   Per HPI unless specifically indicated above   Allergies as of 02/11/2023   No Known Allergies      Medication List        Accurate as of February 11, 2023  4:20 PM. If you have any questions, ask your nurse  or doctor.          STOP taking these medications    fluticasone 50 MCG/ACT nasal spray Commonly known as: FLONASE Stopped by: Fransisca Kaufmann Jasman Murri, MD   ketoconazole 2 % cream Commonly known as: NIZORAL Stopped by: Worthy Rancher, MD   methotrexate 2.5 MG tablet Commonly known as: RHEUMATREX Stopped by: Fransisca Kaufmann Ayah Cozzolino, MD   olopatadine 0.1 % ophthalmic solution Commonly known as: Patanol Stopped by: Fransisca Kaufmann Tammi Boulier, MD       TAKE these medications    cetirizine 10 MG tablet Commonly known as: ZYRTEC Take 1 tablet (10 mg total) by mouth daily.   folic acid 1 MG tablet Commonly known as: FOLVITE Take 1 mg by mouth daily.   inFLIXimab 100 MG injection Commonly known as: REMICADE Inject into the vein. Every 2 months   methotrexate 2.5 MG tablet Commonly known as: RHEUMATREX Take by mouth. 10 tablets once a week   omeprazole 40 MG capsule Commonly known as: PRILOSEC Take 1 capsule (40 mg total) by mouth daily. (Needs to be seen before next refill)         Objective:   BP 122/65   Pulse 67   Ht 5\' 7"  (1.702 m)   Wt 181 lb (82.1 kg)   SpO2 99%   BMI 28.35 kg/m   Wt Readings from Last 3 Encounters:  02/11/23 181 lb (82.1 kg)  11/05/22 178 lb (80.7 kg)  08/06/22 181 lb (  82.1 kg)    Physical Exam Vitals and nursing note reviewed.  Constitutional:      General: He is not in acute distress.    Appearance: He is well-developed. He is not diaphoretic.  Eyes:     General: No scleral icterus.    Conjunctiva/sclera: Conjunctivae normal.  Neck:     Thyroid: No thyromegaly.  Cardiovascular:     Rate and Rhythm: Normal rate and regular rhythm.     Heart sounds: Normal heart sounds. No murmur heard. Pulmonary:     Effort: Pulmonary effort is normal. No respiratory distress.     Breath sounds: Normal breath sounds. No wheezing.  Musculoskeletal:        General: No swelling. Normal range of motion.     Cervical back: Neck supple.   Lymphadenopathy:     Cervical: No cervical adenopathy.  Skin:    General: Skin is warm and dry.     Findings: No rash.  Neurological:     Mental Status: He is alert and oriented to person, place, and time.     Coordination: Coordination normal.  Psychiatric:        Behavior: Behavior normal.       Assessment & Plan:   Problem List Items Addressed This Visit       Endocrine   Type 2 diabetes mellitus with other specified complication ([place]) - Primary   Relevant Orders   Bayer DCA Hb A1c Waived   Other Visit Diagnoses     Gastroesophageal reflux disease with esophagitis       Relevant Medications   omeprazole (PRILOSEC) 40 MG capsule   Need for Tdap vaccination       Relevant Orders   Tdap vaccine greater than or equal to 7yo IM (Completed)       A1c is 6.5, seems like he is doing slightly better with diet, continue to encourage diet. Follow up plan: Return in about 3 months (around 05/14/2023), or if symptoms worsen or fail to improve, for Type 2 diabetes.  Counseling provided for all of the vaccine components Orders Placed This Encounter  Procedures   Tdap vaccine greater than or equal to 7yo IM   Bayer DCA Hb A1c [place], MD [facility] 02/11/2023, 4:20 PM

## 2023-02-14 LAB — BAYER DCA HB A1C WAIVED: HB A1C (BAYER DCA - WAIVED): 6.5 % — ABNORMAL HIGH (ref 4.8–5.6)

## 2023-02-28 ENCOUNTER — Encounter: Payer: Self-pay | Admitting: Family Medicine

## 2023-02-28 ENCOUNTER — Ambulatory Visit: Payer: Commercial Managed Care - PPO | Admitting: Family Medicine

## 2023-02-28 ENCOUNTER — Ambulatory Visit: Payer: Commercial Managed Care - PPO

## 2023-02-28 VITALS — BP 118/70 | HR 79 | Ht 67.0 in | Wt 178.0 lb

## 2023-02-28 DIAGNOSIS — J40 Bronchitis, not specified as acute or chronic: Secondary | ICD-10-CM

## 2023-02-28 MED ORDER — BENZONATATE 100 MG PO CAPS: 100.0000 mg | ORAL_CAPSULE | Freq: Two times a day (BID) | ORAL | 1 refills | Status: DC | PRN

## 2023-02-28 MED ORDER — AMOXICILLIN 500 MG PO CAPS: 500.0000 mg | ORAL_CAPSULE | Freq: Two times a day (BID) | ORAL | 0 refills | Status: DC

## 2023-02-28 NOTE — Progress Notes (Signed)
BP 118/70   Pulse 79   Ht 5\' 7"  (1.702 m)   Wt 178 lb (80.7 kg)   SpO2 97%   BMI 27.88 kg/m    Subjective:   Patient ID: Christopher Haas, male    DOB: [birth year], 62 y.o.   MRN: [id number]  HPI: Christopher Haas is a 62 y.o. male presenting on 02/28/2023 for Cough (Dry, hacking cough) and Nasal Congestion   HPI Cough and chest congestion Patient is coming in today with complaints of cough and chest congestion going on for the past 1 week and has been having a dry and hacking cough.  He does get that in his chest and is having chest pains with all the coughing and his lower back is hurting her with all the coughing and having sinus pressure and postnasal drainage.  He has not had any fevers but does feel hot and cold sometimes.  He says sometimes his chest hurts from all the coughing.  He says the cough is worse during the daytime and nighttime..  Relevant past medical, surgical, family and social history reviewed and updated as indicated. Interim medical history since our last visit reviewed. Allergies and medications reviewed and updated.  Review of Systems  Constitutional:  Negative for chills and fever.  HENT:  Positive for congestion, postnasal drip, rhinorrhea and sinus pressure. Negative for ear discharge, ear pain, sneezing, sore throat and voice change.   Eyes:  Negative for pain, discharge, redness and visual disturbance.  Respiratory:  Positive for cough. Negative for shortness of breath and wheezing.   Cardiovascular:  Negative for chest pain and leg swelling.  Musculoskeletal:  Negative for back pain and gait problem.  Skin:  Negative for rash.  All other systems reviewed and are negative.   Per HPI unless specifically indicated above   Allergies as of 02/28/2023   No Known Allergies      Medication List        Accurate as of February 28, 2023  3:05 PM. If you have any questions, ask your nurse or doctor.          amoxicillin 500 MG capsule Commonly known  as: AMOXIL Take 1 capsule (500 mg total) by mouth 2 (two) times daily. Started by: Nils Pyle, MD   benzonatate 100 MG capsule Commonly known as: Lawyer Take 1 capsule (100 mg total) by mouth 2 (two) times daily as needed for cough. Started by: Nils Pyle, MD   cetirizine 10 MG tablet Commonly known as: ZYRTEC Take 1 tablet (10 mg total) by mouth daily.   folic acid 1 MG tablet Commonly known as: FOLVITE Take 1 mg by mouth daily.   inFLIXimab 100 MG injection Commonly known as: REMICADE Inject into the vein. Every 2 months   methotrexate 2.5 MG tablet Commonly known as: RHEUMATREX Take by mouth. 10 tablets once a week   omeprazole 40 MG capsule Commonly known as: PRILOSEC Take 1 capsule (40 mg total) by mouth daily. (Needs to be seen before next refill)         Objective:   BP 118/70   Pulse 79   Ht 5\' 7"  (1.702 m)   Wt 178 lb (80.7 kg)   SpO2 97%   BMI 27.88 kg/m   Wt Readings from Last 3 Encounters:  02/28/23 178 lb (80.7 kg)  02/11/23 181 lb (82.1 kg)  11/05/22 178 lb (80.7 kg)    Physical Exam Vitals and nursing note reviewed.  Constitutional:  General: He is not in acute distress.    Appearance: He is well-developed. He is not diaphoretic.  HENT:     Right Ear: Tympanic membrane, ear canal and external ear normal.     Left Ear: Tympanic membrane, ear canal and external ear normal.     Nose: Mucosal edema and rhinorrhea present.     Right Sinus: Maxillary sinus tenderness present. No frontal sinus tenderness.     Left Sinus: Maxillary sinus tenderness present. No frontal sinus tenderness.     Mouth/Throat:     Pharynx: Uvula midline. No oropharyngeal exudate or posterior oropharyngeal erythema.     Tonsils: No tonsillar abscesses.  Eyes:     General: No scleral icterus.       Right eye: No discharge.     Conjunctiva/sclera: Conjunctivae normal.     Pupils: Pupils are equal, round, and reactive to light.  Neck:      Thyroid: No thyromegaly.  Cardiovascular:     Rate and Rhythm: Normal rate and regular rhythm.     Heart sounds: Normal heart sounds. No murmur heard. Pulmonary:     Effort: Pulmonary effort is normal. No respiratory distress.     Breath sounds: Normal breath sounds. No wheezing or rales.  Musculoskeletal:        General: Normal range of motion.     Cervical back: Neck supple.  Lymphadenopathy:     Cervical: No cervical adenopathy.  Skin:    General: Skin is warm and dry.     Findings: No rash.  Neurological:     Mental Status: He is alert and oriented to person, place, and time.     Coordination: Coordination normal.  Psychiatric:        Behavior: Behavior normal.       Assessment & Plan:   Problem List Items Addressed This Visit   None Visit Diagnoses     Bronchitis    -  Primary   Relevant Medications   amoxicillin (AMOXIL) 500 MG capsule   benzonatate (TESSALON PERLES) 100 MG capsule     Will treat with amoxicillin and Tessalon Perles.  Follow up plan: Return if symptoms worsen or fail to improve.  Counseling provided for all of the vaccine components No orders of the defined types were placed in this encounter.   Arville Care, MD [facility] 02/28/2023, 3:05 PM

## 2023-03-21 ENCOUNTER — Ambulatory Visit: Payer: Commercial Managed Care - PPO | Admitting: Family Medicine

## 2023-03-21 ENCOUNTER — Encounter: Payer: Self-pay | Admitting: Family Medicine

## 2023-03-21 VITALS — BP 120/74 | HR 77 | Temp 98.0°F | Resp 20 | Ht 67.0 in | Wt 180.0 lb

## 2023-03-21 DIAGNOSIS — R053 Chronic cough: Secondary | ICD-10-CM

## 2023-03-21 DIAGNOSIS — K2100 Gastro-esophageal reflux disease with esophagitis, without bleeding: Secondary | ICD-10-CM

## 2023-03-21 MED ORDER — OMEPRAZOLE 40 MG PO CPDR: 40.0000 mg | DELAYED_RELEASE_CAPSULE | Freq: Every day | ORAL | 3 refills | Status: DC

## 2023-03-21 MED ORDER — BENZONATATE 100 MG PO CAPS: 100.0000 mg | ORAL_CAPSULE | Freq: Two times a day (BID) | ORAL | 1 refills | Status: DC | PRN

## 2023-03-21 MED ORDER — FLUTICASONE PROPIONATE 50 MCG/ACT NA SUSP: 1.0000 | Freq: Two times a day (BID) | NASAL | 6 refills | Status: DC | PRN

## 2023-03-21 MED ORDER — DIPHENHYDRAMINE HCL 25 MG PO TABS: 25.0000 mg | ORAL_TABLET | Freq: Every evening | ORAL | 2 refills | Status: DC | PRN

## 2023-03-21 MED ORDER — FEXOFENADINE HCL 180 MG PO TABS: 180.0000 mg | ORAL_TABLET | Freq: Every day | ORAL | 2 refills | Status: DC

## 2023-03-21 NOTE — Progress Notes (Signed)
BP 120/74   Pulse 77   Temp 98 F (36.7 C) (Oral)   Resp 20   Ht 5\' 7"  (1.702 m)   Wt 180 lb (81.6 kg)   SpO2 97%   BMI 28.19 kg/m    Subjective:   Patient ID: Christopher Haas, male    DOB: March 12, 1961, 62 y.o.   MRN: [id number]  HPI: Christopher Haas is a 62 y.o. male presenting on 03/21/2023 for Cough   HPI Persistent cough Patient is coming in today with persistent cough.  He says his reflux has been better but he says every time around this time a year mark she usually gets a cough for a week or 2 but then usually goes away, this time it has been lasting longer and it has been more like 3 to 4 weeks and it does not seem to be going away.  He did do amoxicillin and Zyrtec and it did help while he was on them and then it stopped helping when he came off of them.  He is taking Lawyer but it does not seem to be helping.  He denies any fevers or chills or shortness of breath or wheezing.  He denies any ill feeling at all but just this cough that is nagging and annoying and sometimes turns into coughing spells and irritation.  It increases when he eats something or eats something cold or eats something dry.  Sometimes it increases when he goes into cooler weather as well.  Cough is really not productive very much.  Relevant past medical, surgical, family and social history reviewed and updated as indicated. Interim medical history since our last visit reviewed. Allergies and medications reviewed and updated.  Review of Systems  Constitutional:  Negative for chills and fever.  HENT:  Positive for sneezing. Negative for congestion, ear discharge, ear pain, postnasal drip, rhinorrhea, sinus pressure, sore throat and voice change.   Eyes:  Negative for pain, discharge, redness and visual disturbance.  Respiratory:  Positive for cough. Negative for shortness of breath and wheezing.   Cardiovascular:  Negative for chest pain and leg swelling.  Musculoskeletal:  Negative for gait  problem.  Skin:  Negative for rash.  All other systems reviewed and are negative.   Per HPI unless specifically indicated above   Allergies as of 03/21/2023   No Known Allergies      Medication List        Accurate as of March 21, 2023  4:08 PM. If you have any questions, ask your nurse or doctor.          STOP taking these medications    amoxicillin 500 MG capsule Commonly known as: AMOXIL Stopped by: Elige Radon Evette Diclemente, MD   cetirizine 10 MG tablet Commonly known as: ZYRTEC Stopped by: Nils Pyle, MD       TAKE these medications    benzonatate 100 MG capsule Commonly known as: Tessalon Perles Take 1 capsule (100 mg total) by mouth 2 (two) times daily as needed for cough.   diphenhydrAMINE 25 MG tablet Commonly known as: BENADRYL Take 1 tablet (25 mg total) by mouth at bedtime as needed. Started by: Nils Pyle, MD   fexofenadine 180 MG tablet Commonly known as: Allegra Allergy Take 1 tablet (180 mg total) by mouth daily. Started by: Elige Radon Mikailah Morel, MD   fluticasone 50 MCG/ACT nasal spray Commonly known as: FLONASE Place 1 spray into both nostrils 2 (two) times daily as needed for allergies  or rhinitis. Started by: Nils Pyle, MD   folic acid 1 MG tablet Commonly known as: FOLVITE Take 1 mg by mouth daily.   inFLIXimab 100 MG injection Commonly known as: REMICADE Inject into the vein. Every 2 months   methotrexate 2.5 MG tablet Commonly known as: RHEUMATREX Take by mouth. 10 tablets once a week   omeprazole 40 MG capsule Commonly known as: PRILOSEC Take 1 capsule (40 mg total) by mouth daily. What changed: additional instructions Changed by: Elige Radon Erick Murin, MD         Objective:   BP 120/74   Pulse 77   Temp 98 F (36.7 C) (Oral)   Resp 20   Ht 5\' 7"  (1.702 m)   Wt 180 lb (81.6 kg)   SpO2 97%   BMI 28.19 kg/m   Wt Readings from Last 3 Encounters:  03/21/23 180 lb (81.6 kg)  02/28/23 178 lb  (80.7 kg)  02/11/23 181 lb (82.1 kg)    Physical Exam Vitals and nursing note reviewed.  Constitutional:      General: He is not in acute distress.    Appearance: He is well-developed. He is not diaphoretic.  Eyes:     General: No scleral icterus.    Conjunctiva/sclera: Conjunctivae normal.  Neck:     Thyroid: No thyromegaly.  Cardiovascular:     Rate and Rhythm: Normal rate and regular rhythm.     Heart sounds: Normal heart sounds. No murmur heard. Pulmonary:     Effort: Pulmonary effort is normal. No respiratory distress.     Breath sounds: Normal breath sounds. No wheezing, rhonchi or rales.  Musculoskeletal:        General: Normal range of motion.     Cervical back: Neck supple.  Lymphadenopathy:     Cervical: No cervical adenopathy.  Skin:    General: Skin is warm and dry.     Findings: No rash.  Neurological:     Mental Status: He is alert and oriented to person, place, and time.     Coordination: Coordination normal.  Psychiatric:        Behavior: Behavior normal.       Assessment & Plan:   Problem List Items Addressed This Visit   None Visit Diagnoses     Persistent cough for 3 weeks or longer    -  Primary   Relevant Medications   diphenhydrAMINE (BENADRYL) 25 MG tablet   fexofenadine (ALLEGRA ALLERGY) 180 MG tablet   fluticasone (FLONASE) 50 MCG/ACT nasal spray   benzonatate (TESSALON PERLES) 100 MG capsule   Gastroesophageal reflux disease with esophagitis       Relevant Medications   omeprazole (PRILOSEC) 40 MG capsule       Will try adding Benadryl and Allegra, 1 in the morning 1 in the evening and Flonase twice a day, continue the [employer].  I told him to do this for at least 2 weeks, if still not improved then we may send him to ear nose throat to have a nasopharyngoscopy to look and see what is going on. Follow up plan: Return if symptoms worsen or fail to improve.  Counseling provided for all of the vaccine components No orders of  the defined types were placed in this encounter.   Arville Care, MD [facility] 03/21/2023, 4:08 PM

## 2023-05-18 ENCOUNTER — Ambulatory Visit: Payer: Commercial Managed Care - PPO | Admitting: Family Medicine

## 2023-05-18 ENCOUNTER — Encounter: Payer: Self-pay | Admitting: Family Medicine

## 2023-05-18 VITALS — BP 122/77 | HR 76 | Ht 67.0 in | Wt 178.0 lb

## 2023-05-18 DIAGNOSIS — R12 Heartburn: Secondary | ICD-10-CM

## 2023-05-18 DIAGNOSIS — E1169 Type 2 diabetes mellitus with other specified complication: Secondary | ICD-10-CM

## 2023-05-18 LAB — BAYER DCA HB A1C WAIVED: HB A1C (BAYER DCA - WAIVED): 6.4 % — ABNORMAL HIGH (ref 4.8–5.6)

## 2023-05-18 NOTE — Progress Notes (Signed)
BP 122/77   Pulse 76   Ht 5\' 7"  (1.702 m)   Wt 178 lb (80.7 kg)   SpO2 97%   BMI 27.88 kg/m    Subjective:   Patient ID: Christopher Haas, male    DOB: [birth year], 62 y.o.   MRN: [id number]  HPI: Christopher Haas is a 62 y.o. male presenting on 05/18/2023 for Medical Management of Chronic Issues and Diabetes   HPI Type 2 diabetes mellitus Patient comes in today for recheck of his diabetes. Patient has been currently taking no medicine, diet control.. Patient is not currently on an ACE inhibitor/ARB. Patient has not seen an ophthalmologist this year. Patient denies any new issues with their feet. The symptom started onset as an adult GERD ARE RELATED TO DM   GERD Patient is currently on omeprazole.  She denies any major symptoms or abdominal pain or belching or burping. She denies any blood in her stool or lightheadedness or dizziness.   Relevant past medical, surgical, family and social history reviewed and updated as indicated. Interim medical history since our last visit reviewed. Allergies and medications reviewed and updated.  Review of Systems  Constitutional:  Negative for chills and fever.  Eyes:  Negative for visual disturbance.  Respiratory:  Negative for shortness of breath and wheezing.   Cardiovascular:  Negative for chest pain and leg swelling.  Musculoskeletal:  Negative for back pain and gait problem.  Skin:  Negative for rash.  Neurological:  Negative for dizziness, weakness and light-headedness.  All other systems reviewed and are negative.   Per HPI unless specifically indicated above   Allergies as of 05/18/2023   No Known Allergies      Medication List        Accurate as of May 18, 2023  4:06 PM. If you have any questions, ask your nurse or doctor.          benzonatate 100 MG capsule Commonly known as: Tessalon Perles Take 1 capsule (100 mg total) by mouth 2 (two) times daily as needed for cough.   diphenhydrAMINE 25 MG tablet Commonly  known as: BENADRYL Take 1 tablet (25 mg total) by mouth at bedtime as needed.   fexofenadine 180 MG tablet Commonly known as: Allegra Allergy Take 1 tablet (180 mg total) by mouth daily.   fluticasone 50 MCG/ACT nasal spray Commonly known as: FLONASE Place 1 spray into both nostrils 2 (two) times daily as needed for allergies or rhinitis.   folic acid 1 MG tablet Commonly known as: FOLVITE Take 1 mg by mouth daily.   inFLIXimab 100 MG injection Commonly known as: REMICADE Inject into the vein. Every 2 months   methotrexate 2.5 MG tablet Commonly known as: RHEUMATREX Take by mouth. 10 tablets once a week   omeprazole 40 MG capsule Commonly known as: PRILOSEC Take 1 capsule (40 mg total) by mouth daily.         Objective:   BP 122/77   Pulse 76   Ht 5\' 7"  (1.702 m)   Wt 178 lb (80.7 kg)   SpO2 97%   BMI 27.88 kg/m   Wt Readings from Last 3 Encounters:  05/18/23 178 lb (80.7 kg)  03/21/23 180 lb (81.6 kg)  02/28/23 178 lb (80.7 kg)    Physical Exam Vitals and nursing note reviewed.  Constitutional:      General: He is not in acute distress.    Appearance: He is well-developed. He is not diaphoretic.  Eyes:  General: No scleral icterus.    Conjunctiva/sclera: Conjunctivae normal.  Neck:     Thyroid: No thyromegaly.  Cardiovascular:     Rate and Rhythm: Normal rate and regular rhythm.     Heart sounds: Normal heart sounds. No murmur heard. Pulmonary:     Effort: Pulmonary effort is normal. No respiratory distress.     Breath sounds: Normal breath sounds. No wheezing.  Musculoskeletal:        General: No swelling. Normal range of motion.     Cervical back: Neck supple.  Lymphadenopathy:     Cervical: No cervical adenopathy.  Skin:    General: Skin is warm and dry.     Findings: No rash.  Neurological:     Mental Status: He is alert and oriented to person, place, and time.     Coordination: Coordination normal.  Psychiatric:        Behavior:  Behavior normal.       Assessment & Plan:   Problem List Items Addressed This Visit       Endocrine   Type 2 diabetes mellitus with other specified complication (HCC) - Primary   Relevant Orders   Bayer DCA Hb A1c Waived   Microalbumin / creatinine urine ratio     Other   Heartburn    A1c looks good at 6.4, will do urine microalbumin today.  We will get him scheduled for the in-house eye screen for Follow up plan: Return in about 3 months (around 08/18/2023), or if symptoms worsen or fail to improve, for Diabetes recheck.  Counseling provided for all of the vaccine components Orders Placed This Encounter  Procedures   Bayer DCA Hb A1c Waived   Microalbumin / creatinine urine ratio    Arville Care, MD [facility] 05/18/2023, 4:06 PM

## 2023-05-19 LAB — MICROALBUMIN / CREATININE URINE RATIO
Creatinine, Urine: 200.2 mg/dL
Microalb/Creat Ratio: 4 mg/g creat (ref 0–29)
Microalbumin, Urine: 8 ug/mL

## 2023-08-25 ENCOUNTER — Ambulatory Visit: Payer: Commercial Managed Care - PPO | Admitting: Family Medicine

## 2023-08-25 ENCOUNTER — Encounter: Payer: Self-pay | Admitting: Family Medicine

## 2023-08-25 VITALS — BP 124/79 | HR 64 | Ht 67.0 in | Wt 184.0 lb

## 2023-08-25 DIAGNOSIS — Z23 Encounter for immunization: Secondary | ICD-10-CM | POA: Diagnosis not present

## 2023-08-25 DIAGNOSIS — E1169 Type 2 diabetes mellitus with other specified complication: Secondary | ICD-10-CM | POA: Diagnosis not present

## 2023-08-25 DIAGNOSIS — R12 Heartburn: Secondary | ICD-10-CM | POA: Diagnosis not present

## 2023-08-25 DIAGNOSIS — K51819 Other ulcerative colitis with unspecified complications: Secondary | ICD-10-CM | POA: Diagnosis not present

## 2023-08-25 LAB — BAYER DCA HB A1C WAIVED: HB A1C (BAYER DCA - WAIVED): 6.6 % — ABNORMAL HIGH (ref 4.8–5.6)

## 2023-08-25 NOTE — Progress Notes (Signed)
BP 124/79   Pulse 64   Ht 5\' 7"  (1.702 m)   Wt 184 lb (83.5 kg)   SpO2 96%   BMI 28.82 kg/m    Subjective:   Patient ID: Christopher Haas, male    DOB: [birth year], 62 y.o.   MRN: [id number]  HPI: Christopher Haas is a 62 y.o. male presenting on 08/25/2023 for Medical Management of Chronic Issues and Diabetes   HPI Type 2 diabetes mellitus Patient comes in today for recheck of his diabetes. Patient has been currently taking no medicine currently, diet control. Patient is not currently on an ACE inhibitor/ARB. Patient has not seen an ophthalmologist this year. Patient denies any new issues with their feet. The symptom started onset as an adult ulcerative colitis and GERD ARE RELATED TO DM   GERD Patient is currently on omeprazole.  She denies any major symptoms or abdominal pain or belching or burping. She denies any blood in her stool or lightheadedness or dizziness.   Relevant past medical, surgical, family and social history reviewed and updated as indicated. Interim medical history since our last visit reviewed. Allergies and medications reviewed and updated.  Review of Systems  Constitutional:  Negative for chills and fever.  Eyes:  Negative for visual disturbance.  Respiratory:  Negative for shortness of breath and wheezing.   Cardiovascular:  Negative for chest pain and leg swelling.  Musculoskeletal:  Negative for back pain and gait problem.  Skin:  Negative for rash.  Neurological:  Negative for dizziness, weakness and light-headedness.  All other systems reviewed and are negative.   Per HPI unless specifically indicated above   Allergies as of 08/25/2023   No Known Allergies      Medication List        Accurate as of August 25, 2023  4:15 PM. If you have any questions, ask your nurse or doctor.          benzonatate 100 MG capsule Commonly known as: Tessalon Perles Take 1 capsule (100 mg total) by mouth 2 (two) times daily as needed for cough.    diphenhydrAMINE 25 MG tablet Commonly known as: BENADRYL Take 1 tablet (25 mg total) by mouth at bedtime as needed.   fexofenadine 180 MG tablet Commonly known as: Allegra Allergy Take 1 tablet (180 mg total) by mouth daily.   fluticasone 50 MCG/ACT nasal spray Commonly known as: FLONASE Place 1 spray into both nostrils 2 (two) times daily as needed for allergies or rhinitis.   folic acid 1 MG tablet Commonly known as: FOLVITE Take 1 mg by mouth daily.   inFLIXimab 100 MG injection Commonly known as: REMICADE Inject into the vein. Every 2 months   methotrexate 2.5 MG tablet Commonly known as: RHEUMATREX Take by mouth. 10 tablets once a week   omeprazole 40 MG capsule Commonly known as: PRILOSEC Take 1 capsule (40 mg total) by mouth daily.         Objective:   BP 124/79   Pulse 64   Ht 5\' 7"  (1.702 m)   Wt 184 lb (83.5 kg)   SpO2 96%   BMI 28.82 kg/m   Wt Readings from Last 3 Encounters:  08/25/23 184 lb (83.5 kg)  05/18/23 178 lb (80.7 kg)  03/21/23 180 lb (81.6 kg)    Physical Exam Vitals and nursing note reviewed.  Constitutional:      General: He is not in acute distress.    Appearance: He is well-developed. He is not diaphoretic.  Eyes:     General: No scleral icterus.    Conjunctiva/sclera: Conjunctivae normal.  Neck:     Thyroid: No thyromegaly.  Cardiovascular:     Rate and Rhythm: Normal rate and regular rhythm.     Heart sounds: Normal heart sounds. No murmur heard. Pulmonary:     Effort: Pulmonary effort is normal. No respiratory distress.     Breath sounds: Normal breath sounds. No wheezing.  Musculoskeletal:        General: No swelling. Normal range of motion.     Cervical back: Neck supple.  Lymphadenopathy:     Cervical: No cervical adenopathy.  Skin:    General: Skin is warm and dry.     Findings: No rash.  Neurological:     Mental Status: He is alert and oriented to person, place, and time.     Coordination: Coordination  normal.  Psychiatric:        Behavior: Behavior normal.       Assessment & Plan:   Problem List Items Addressed This Visit       Digestive   Ulcerative colitis (HCC)     Endocrine   Type 2 diabetes mellitus with other specified complication (HCC) - Primary   Relevant Orders   CBC with Differential/Platelet   BMP8+EGFR   Lipid panel   Bayer DCA Hb A1c Waived     Other   Heartburn  A1c 6.6, looks good, seems to be doing well otherwise.  He says his energy gets down sometimes after the Remicade but we did also discuss apnea and snoring and says he does not want to go for testing at this point but he does snore some He does still see gastroenterology for ulcerative colitis and that seems to be under control. Follow up plan: Return in about 6 months (around 02/23/2024), or if symptoms worsen or fail to improve, for Diabetes recheck.  Counseling provided for all of the vaccine components Orders Placed This Encounter  Procedures   CBC with Differential/Platelet   BMP8+EGFR   Lipid panel   Bayer DCA Hb A1c Waived    Arville Care, MD [facility] 08/25/2023, 4:15 PM

## 2023-08-26 LAB — BMP8+EGFR
BUN/Creatinine Ratio: 18 (ref 10–24)
BUN: 19 mg/dL (ref 8–27)
CO2: 26 mmol/L (ref 20–29)
Calcium: 9.9 mg/dL (ref 8.6–10.2)
Chloride: 98 mmol/L (ref 96–106)
Creatinine, Ser: 1.04 mg/dL (ref 0.76–1.27)
Glucose: 122 mg/dL — ABNORMAL HIGH (ref 70–99)
Potassium: 4.4 mmol/L (ref 3.5–5.2)
Sodium: 138 mmol/L (ref 134–144)
eGFR: 82 mL/min/{1.73_m2} (ref >59)

## 2023-08-26 LAB — CBC WITH DIFFERENTIAL/PLATELET
Basophils Absolute: 0.1 10*3/uL (ref 0.0–0.2)
Basos: 1 %
EOS (ABSOLUTE): 0.4 10*3/uL (ref 0.0–0.4)
Eos: 4 %
Hematocrit: 46.1 % (ref 37.5–51.0)
Hemoglobin: 15.3 g/dL (ref 13.0–17.7)
Immature Grans (Abs): 0 10*3/uL (ref 0.0–0.1)
Immature Granulocytes: 0 %
Lymphocytes Absolute: 5.4 10*3/uL — ABNORMAL HIGH (ref 0.7–3.1)
Lymphs: 52 %
MCH: 31 pg (ref 26.6–33.0)
MCHC: 33.2 g/dL (ref 31.5–35.7)
MCV: 94 fL (ref 79–97)
Monocytes Absolute: 0.8 10*3/uL (ref 0.1–0.9)
Monocytes: 8 %
Neutrophils Absolute: 3.6 10*3/uL (ref 1.4–7.0)
Neutrophils: 35 %
Platelets: 202 10*3/uL (ref 150–450)
RBC: 4.93 x10E6/uL (ref 4.14–5.80)
RDW: 13.2 % (ref 11.6–15.4)
WBC: 10.3 10*3/uL (ref 3.4–10.8)

## 2023-08-26 LAB — LIPID PANEL
Chol/HDL Ratio: 6.8 {ratio} — ABNORMAL HIGH (ref 0.0–5.0)
Cholesterol, Total: 162 mg/dL (ref 100–199)
HDL: 24 mg/dL — ABNORMAL LOW (ref >39)
LDL Chol Calc (NIH): 79 mg/dL (ref 0–99)
Triglycerides: 363 mg/dL — ABNORMAL HIGH (ref 0–149)
VLDL Cholesterol Cal: 59 mg/dL — ABNORMAL HIGH (ref 5–40)

## 2023-08-29 DIAGNOSIS — Z23 Encounter for immunization: Secondary | ICD-10-CM | POA: Diagnosis not present

## 2023-10-26 ENCOUNTER — Telehealth: Payer: Self-pay | Admitting: Family Medicine

## 2023-11-09 ENCOUNTER — Ambulatory Visit: Payer: Commercial Managed Care - PPO | Admitting: Family Medicine

## 2023-11-09 VITALS — BP 123/78 | HR 61 | Temp 97.4°F | Ht 67.0 in | Wt 179.0 lb

## 2023-11-09 DIAGNOSIS — G9331 Postviral fatigue syndrome: Secondary | ICD-10-CM | POA: Diagnosis not present

## 2023-11-09 DIAGNOSIS — R051 Acute cough: Secondary | ICD-10-CM | POA: Diagnosis not present

## 2023-11-09 DIAGNOSIS — M2550 Pain in unspecified joint: Secondary | ICD-10-CM | POA: Diagnosis not present

## 2023-11-09 MED ORDER — BETAMETHASONE SOD PHOS & ACET 6 (3-3) MG/ML IJ SUSP
6.0000 mg | Freq: Once | INTRAMUSCULAR | Status: AC
Start: 2023-11-09 — End: 2023-11-09
  Administered 2023-11-09: 6 mg via INTRAMUSCULAR

## 2023-11-09 NOTE — Progress Notes (Signed)
Chief Complaint  Patient presents with   Fatigue   Abdominal Pain    HPI  Patient presents today for electrical pain X 2-3 weeks.Pain is in the Abd, BUQ. Also left chest, left knee, left foot and right forearm. It has been off and on for several weeks.   PMH: Smoking status noted ROS: Per HPI  Objective: BP 123/78   Pulse 61   Temp (!) 97.4 F (36.3 C)   Ht 5\' 7"  (1.702 m)   Wt 179 lb (81.2 kg)   SpO2 98%   BMI 28.04 kg/m  Gen: NAD, alert, cooperative with exam HEENT: NCAT, EOMI, PERRL CV: RRR, good S1/S2, no murmur Resp: CTABL, no wheezes, non-labored Abd: SNTND, BS present, no guarding or organomegaly Ext: No edema, warm Neuro: Alert and oriented, No gross deficits  Assessment and plan:  1. Post viral syndrome   2. Acute cough   3. Arthralgia, unspecified joint     Meds ordered this encounter  Medications   betamethasone acetate-betamethasone sodium phosphate (CELESTONE) injection 6 mg    No orders of the defined types were placed in this encounter.   Follow up as needed.  Mechele Claude, MD

## 2023-11-13 ENCOUNTER — Encounter: Payer: Self-pay | Admitting: Family Medicine

## 2024-02-24 ENCOUNTER — Encounter: Payer: Self-pay | Admitting: Family Medicine

## 2024-02-24 ENCOUNTER — Ambulatory Visit: Payer: Commercial Managed Care - PPO | Admitting: Family Medicine

## 2024-02-24 VITALS — BP 134/75 | HR 65 | Ht 67.0 in | Wt 180.0 lb

## 2024-02-24 DIAGNOSIS — J301 Allergic rhinitis due to pollen: Secondary | ICD-10-CM

## 2024-02-24 DIAGNOSIS — E1169 Type 2 diabetes mellitus with other specified complication: Secondary | ICD-10-CM | POA: Diagnosis not present

## 2024-02-24 DIAGNOSIS — K2100 Gastro-esophageal reflux disease with esophagitis, without bleeding: Secondary | ICD-10-CM

## 2024-02-24 DIAGNOSIS — R12 Heartburn: Secondary | ICD-10-CM | POA: Diagnosis not present

## 2024-02-24 DIAGNOSIS — R053 Chronic cough: Secondary | ICD-10-CM | POA: Diagnosis not present

## 2024-02-24 LAB — BAYER DCA HB A1C WAIVED: HB A1C (BAYER DCA - WAIVED): 8.3 % — ABNORMAL HIGH (ref 4.8–5.6)

## 2024-02-24 MED ORDER — PANTOPRAZOLE SODIUM 40 MG PO TBEC: 40.0000 mg | DELAYED_RELEASE_TABLET | Freq: Every day | ORAL | 3 refills | Status: DC

## 2024-02-24 MED ORDER — FLUTICASONE PROPIONATE 50 MCG/ACT NA SUSP
1.0000 | Freq: Two times a day (BID) | NASAL | 6 refills | Status: DC | PRN
Start: 2024-02-24 — End: 2024-09-27

## 2024-02-24 MED ORDER — CETIRIZINE HCL 10 MG PO TABS: 10.0000 mg | ORAL_TABLET | Freq: Every day | ORAL | 3 refills | Status: DC

## 2024-02-24 MED ORDER — METFORMIN HCL 500 MG PO TABS: 500.0000 mg | ORAL_TABLET | Freq: Two times a day (BID) | ORAL | 3 refills | Status: DC

## 2024-02-24 MED ORDER — MUPIROCIN 2 % EX OINT: 1.0000 | TOPICAL_OINTMENT | Freq: Two times a day (BID) | CUTANEOUS | 3 refills | Status: AC

## 2024-02-24 NOTE — Progress Notes (Signed)
 BP 134/75   Pulse 65   Ht 5\' 7"  (1.702 m)   Wt 180 lb (81.6 kg)   SpO2 95%   BMI 28.19 kg/m    Subjective:   Patient ID: Christopher Haas, male    DOB: 06-30-1961, 63 y.o.   MRN: [id number]  HPI: Christopher Haas is a 63 y.o. male presenting on 02/24/2024 for Medical Management of Chronic Issues and Diabetes   HPI Type 2 diabetes mellitus Patient comes in today for recheck of his diabetes. Patient has been currently taking no medication, has been trying diet control but it seems like he has gotten worse.  He says he admits to drinking a lot more soda recently.. Patient is not currently on an ACE inhibitor/ARB. Patient has not seen an ophthalmologist this year. Patient denies any new issues with their feet. The symptom started onset as an adult.  ARE RELATED TO DM   GERD Patient is currently on pantoprazole.  She denies any major symptoms or abdominal pain or belching or burping. She denies any blood in her stool or lightheadedness or dizziness.  He still feels like he gets some symptoms when he eats certain foods and would like to try different medicine to see if it does little better for him.  Allergic rhinitis Patient has allergic rhinitis and gets cough from and takes Flonase and Zyrtec.  He feels like they do well for him and for the most part he keeps it pretty stable on those.  Occasionally he will get some allergies in springtime when he needs to use and or more frequently and then uses some mupirocin for some sores he gets in his nose occasionally.   Relevant past medical, surgical, family and social history reviewed and updated as indicated. Interim medical history since our last visit reviewed. Allergies and medications reviewed and updated.  Review of Systems  Constitutional:  Negative for chills and fever.  HENT:  Positive for congestion and sneezing.   Respiratory:  Positive for cough. Negative for shortness of breath and wheezing.   Cardiovascular:  Negative for chest  pain and leg swelling.  Musculoskeletal:  Negative for back pain and gait problem.  Skin:  Negative for rash.  Neurological:  Negative for dizziness and light-headedness.  All other systems reviewed and are negative.   Per HPI unless specifically indicated above   Allergies as of 02/24/2024   No Known Allergies      Medication List        Accurate as of February 24, 2024  4:34 PM. If you have any questions, ask your nurse or doctor.          STOP taking these medications    benzonatate 100 MG capsule Commonly known as: Lawyer Stopped by: Elige Radon Colbe Viviano   diphenhydrAMINE 25 MG tablet Commonly known as: BENADRYL Stopped by: Elige Radon Louann Hopson   fexofenadine 180 MG tablet Commonly known as: Allegra Allergy Stopped by: Elige Radon Shalini Mair   omeprazole 40 MG capsule Commonly known as: PRILOSEC Stopped by: Elige Radon Derric Dealmeida       TAKE these medications    cetirizine 10 MG tablet Commonly known as: ZYRTEC Take 1 tablet (10 mg total) by mouth daily. Started by: Elige Radon Taffie Eckmann   fluticasone 50 MCG/ACT nasal spray Commonly known as: FLONASE Place 1 spray into both nostrils 2 (two) times daily as needed for allergies or rhinitis.   folic acid 1 MG tablet Commonly known as: FOLVITE Take 1 mg by mouth daily.  inFLIXimab 100 MG injection Commonly known as: REMICADE Inject into the vein. Every 2 months   metFORMIN 500 MG tablet Commonly known as: GLUCOPHAGE Take 1 tablet (500 mg total) by mouth 2 (two) times daily with a meal. Started by: Elige Radon Haskel Dewalt   methotrexate 2.5 MG tablet Commonly known as: RHEUMATREX Take by mouth. 10 tablets once a week   mupirocin ointment 2 % Commonly known as: BACTROBAN Apply 1 Application topically 2 (two) times daily. Intranasal as needed Started by: Elige Radon Fabianna Keats   pantoprazole 40 MG tablet Commonly known as: PROTONIX Take 1 tablet (40 mg total) by mouth daily. Started by: Elige Radon Cecelia Graciano          Objective:   BP 134/75   Pulse 65   Ht 5\' 7"  (1.702 m)   Wt 180 lb (81.6 kg)   SpO2 95%   BMI 28.19 kg/m   Wt Readings from Last 3 Encounters:  02/24/24 180 lb (81.6 kg)  11/09/23 179 lb (81.2 kg)  08/25/23 184 lb (83.5 kg)    Physical Exam Vitals and nursing note reviewed.  Constitutional:      General: He is not in acute distress.    Appearance: He is well-developed. He is not diaphoretic.  Eyes:     General: No scleral icterus.    Conjunctiva/sclera: Conjunctivae normal.  Neck:     Thyroid: No thyromegaly.  Cardiovascular:     Rate and Rhythm: Normal rate and regular rhythm.     Heart sounds: Normal heart sounds. No murmur heard. Pulmonary:     Effort: Pulmonary effort is normal. No respiratory distress.     Breath sounds: Normal breath sounds. No wheezing.  Musculoskeletal:        General: No swelling. Normal range of motion.     Cervical back: Neck supple.  Lymphadenopathy:     Cervical: No cervical adenopathy.  Skin:    General: Skin is warm and dry.     Findings: No rash.  Neurological:     Mental Status: He is alert and oriented to person, place, and time.     Coordination: Coordination normal.  Psychiatric:        Behavior: Behavior normal.       Assessment & Plan:   Problem List Items Addressed This Visit       Endocrine   Type 2 diabetes mellitus with other specified complication (HCC) - Primary   Relevant Medications   metFORMIN (GLUCOPHAGE) 500 MG tablet   Other Relevant Orders   Bayer DCA Hb A1c Waived   Lipid panel     Other   Heartburn   Relevant Medications   pantoprazole (PROTONIX) 40 MG tablet   Hay fever   Relevant Medications   cetirizine (ZYRTEC) 10 MG tablet   Other Visit Diagnoses       Persistent cough for 3 weeks or longer       Relevant Medications   fluticasone (FLONASE) 50 MCG/ACT nasal spray     Gastroesophageal reflux disease with esophagitis without hemorrhage       Relevant Medications    pantoprazole (PROTONIX) 40 MG tablet     A1c is up to 8.3, will start metformin today and encouraged to stop drinking as much sodas.  Switched from omeprazole to pantoprazole because he still feels like he has some issues with certain foods and would like to try something different.  Follow up plan: Return in about 3 months (around 05/25/2024), or if symptoms worsen or  fail to improve, for Diabetes and GERD and allergies.  Counseling provided for all of the vaccine components Orders Placed This Encounter  Procedures   Bayer DCA Hb A1c Waived   Lipid panel    Arville Care, MD [facility] 02/24/2024, 4:34 PM

## 2024-02-25 LAB — LIPID PANEL
Chol/HDL Ratio: 5.4 ratio — ABNORMAL HIGH (ref 0.0–5.0)
Cholesterol, Total: 150 mg/dL (ref 100–199)
HDL: 28 mg/dL — ABNORMAL LOW (ref >39)
LDL Chol Calc (NIH): 77 mg/dL (ref 0–99)
Triglycerides: 276 mg/dL — ABNORMAL HIGH (ref 0–149)
VLDL Cholesterol Cal: 45 mg/dL — ABNORMAL HIGH (ref 5–40)

## 2024-03-02 ENCOUNTER — Encounter: Payer: Self-pay | Admitting: Family Medicine

## 2024-03-17 ENCOUNTER — Other Ambulatory Visit: Payer: Self-pay | Admitting: Family Medicine

## 2024-03-17 DIAGNOSIS — K2100 Gastro-esophageal reflux disease with esophagitis, without bleeding: Secondary | ICD-10-CM

## 2024-04-25 ENCOUNTER — Ambulatory Visit (INDEPENDENT_AMBULATORY_CARE_PROVIDER_SITE_OTHER)

## 2024-04-25 ENCOUNTER — Ambulatory Visit

## 2024-04-25 DIAGNOSIS — E1169 Type 2 diabetes mellitus with other specified complication: Secondary | ICD-10-CM | POA: Diagnosis not present

## 2024-04-25 DIAGNOSIS — Z7984 Long term (current) use of oral hypoglycemic drugs: Secondary | ICD-10-CM | POA: Diagnosis not present

## 2024-04-25 LAB — HM DIABETES EYE EXAM

## 2024-04-25 NOTE — Progress Notes (Signed)
 Christopher Haas arrived 04/25/2024 and has given verbal consent to obtain images and complete their overdue diabetic retinal screening.  The images have been sent to an ophthalmologist or optometrist for review and interpretation.  Results will be sent back to Christopher Haas, Christopher Sabin, MD for review.  Patient has been informed they will be contacted when we receive the results via telephone or MyChart

## 2024-04-27 ENCOUNTER — Ambulatory Visit: Admitting: Family Medicine

## 2024-04-27 ENCOUNTER — Encounter: Payer: Self-pay | Admitting: Family Medicine

## 2024-04-27 VITALS — BP 119/78 | HR 70 | Ht 67.0 in | Wt 179.0 lb

## 2024-04-27 DIAGNOSIS — H43392 Other vitreous opacities, left eye: Secondary | ICD-10-CM | POA: Diagnosis not present

## 2024-04-27 NOTE — Progress Notes (Signed)
 BP 119/78   Pulse 70   Ht 5\' 7"  (1.702 m)   Wt 179 lb (81.2 kg)   SpO2 96%   BMI 28.04 kg/m    Subjective:   Patient ID: Christopher Haas, male    DOB: 03-07-1961, 63 y.o.   MRN: [id number]  HPI: Christopher Haas is a 63 y.o. male presenting on 04/27/2024 for vision changes (Left eye. Cloudy spots in peripheral. Started about 10 days ago.)   HPI Left eye cloudy spots and seeing lines and spots. Patient says about 10 to 12 days ago he started having some spots in his left eye and cloudy spots and he has noticed them in the periphery but that is also noticed a linear spots on the center of his vision as well.  He says he has not lost any vision and does not have any pain in the eye and never had any pain in the eye.  He denies any trauma to the eye.  He does not know what brought on the changes but it just came on all of a sudden and it is only in his left eye.  Relevant past medical, surgical, family and social history reviewed and updated as indicated. Interim medical history since our last visit reviewed. Allergies and medications reviewed and updated.  Review of Systems  Constitutional:  Negative for chills and fever.  Eyes:  Positive for redness (He gets the occasional eye redness but it comes and goes) and visual disturbance. Negative for photophobia, pain, discharge and itching.  Respiratory:  Negative for shortness of breath and wheezing.   Cardiovascular:  Negative for chest pain and leg swelling.  Musculoskeletal:  Negative for back pain and gait problem.  Skin:  Negative for rash.  All other systems reviewed and are negative.   Per HPI unless specifically indicated above   Allergies as of 04/27/2024   No Known Allergies      Medication List        Accurate as of April 27, 2024  4:16 PM. If you have any questions, ask your nurse or doctor.          cetirizine  10 MG tablet Commonly known as: ZYRTEC  Take 1 tablet (10 mg total) by mouth daily.   fluticasone  50  MCG/ACT nasal spray Commonly known as: FLONASE  Place 1 spray into both nostrils 2 (two) times daily as needed for allergies or rhinitis.   folic acid 1 MG tablet Commonly known as: FOLVITE Take 1 mg by mouth daily.   inFLIXimab  100 MG injection Commonly known as: REMICADE  Inject into the vein. Every 2 months   metFORMIN  500 MG tablet Commonly known as: GLUCOPHAGE  Take 1 tablet (500 mg total) by mouth 2 (two) times daily with a meal.   methotrexate 2.5 MG tablet Commonly known as: RHEUMATREX Take by mouth. 10 tablets once a week   mupirocin  ointment 2 % Commonly known as: BACTROBAN  Apply 1 Application topically 2 (two) times daily. Intranasal as needed   pantoprazole  40 MG tablet Commonly known as: PROTONIX  Take 1 tablet (40 mg total) by mouth daily.         Objective:   BP 119/78   Pulse 70   Ht 5\' 7"  (1.702 m)   Wt 179 lb (81.2 kg)   SpO2 96%   BMI 28.04 kg/m   Wt Readings from Last 3 Encounters:  04/27/24 179 lb (81.2 kg)  02/24/24 180 lb (81.6 kg)  11/09/23 179 lb (81.2 kg)    Physical  Exam Vitals and nursing note reviewed.  Constitutional:      Appearance: Normal appearance.  Eyes:     General: Vision grossly intact.        Right eye: No foreign body.        Left eye: No foreign body.     Extraocular Movements: Extraocular movements intact.     Conjunctiva/sclera: Conjunctivae normal.     Pupils: Pupils are equal, round, and reactive to light.  Neurological:     Mental Status: He is alert.       Assessment & Plan:   Problem List Items Addressed This Visit   None Visit Diagnoses       Floaters in visual field, left    -  Primary   Relevant Orders   Ambulatory referral to Ophthalmology     With new floaters and visual changes, placed an urgent referral to ophthalmology for him and instructed him that if he gets any visual deficit changes or pain in the eye that he needs to go to emergency department over the weekend.  Follow up  plan: Return if symptoms worsen or fail to improve.  Counseling provided for all of the vaccine components Orders Placed This Encounter  Procedures   Ambulatory referral to Ophthalmology    Jolyne Needs, MD [facility] 04/27/2024, 4:16 PM

## 2024-06-06 ENCOUNTER — Ambulatory Visit (INDEPENDENT_AMBULATORY_CARE_PROVIDER_SITE_OTHER): Admitting: Family Medicine

## 2024-06-06 ENCOUNTER — Encounter: Payer: Self-pay | Admitting: Family Medicine

## 2024-06-06 VITALS — BP 132/80 | HR 77 | Ht 67.0 in | Wt 179.0 lb

## 2024-06-06 DIAGNOSIS — J301 Allergic rhinitis due to pollen: Secondary | ICD-10-CM

## 2024-06-06 DIAGNOSIS — Z125 Encounter for screening for malignant neoplasm of prostate: Secondary | ICD-10-CM | POA: Diagnosis not present

## 2024-06-06 DIAGNOSIS — R12 Heartburn: Secondary | ICD-10-CM | POA: Diagnosis not present

## 2024-06-06 DIAGNOSIS — Z7984 Long term (current) use of oral hypoglycemic drugs: Secondary | ICD-10-CM

## 2024-06-06 DIAGNOSIS — E1169 Type 2 diabetes mellitus with other specified complication: Secondary | ICD-10-CM

## 2024-06-06 LAB — LIPID PANEL

## 2024-06-06 NOTE — Progress Notes (Signed)
 BP 132/80   Pulse 77   Ht 5' 7 (1.702 m)   Wt 179 lb (81.2 kg)   SpO2 96%   BMI 28.04 kg/m    Subjective:   Patient ID: Christopher Haas, male    DOB: December 12, 1960, 63 y.o.   MRN: [id number]  HPI: [name] is a 63 y.o. male presenting on 06/06/2024 for Medical Management of Chronic Issues, Diabetes, and Gastroesophageal Reflux   HPI Type 2 diabetes mellitus Patient comes in today for recheck of his diabetes. Patient has been currently taking metformin . Patient is not currently on an ACE inhibitor/ARB. Patient has not seen an ophthalmologist this year. Patient denies any new issues with their feet. The symptom started onset as an adult.  ARE RELATED TO DM   GERD Patient is currently on pantoprazole .  She denies any major symptoms or abdominal pain or belching or burping. She denies any blood in her stool or lightheadedness or dizziness.   Seasonal allergies and hay fever Patient takes fluticasone  and Zyrtec  for seasonal allergies and hayfever.  He says he is doing well and he takes medicine as needed and not all the time.  Relevant past medical, surgical, family and social history reviewed and updated as indicated. Interim medical history since our last visit reviewed. Allergies and medications reviewed and updated.  Review of Systems  Constitutional:  Negative for chills and fever.  Eyes:  Negative for visual disturbance.  Respiratory:  Negative for shortness of breath and wheezing.   Cardiovascular:  Negative for chest pain and leg swelling.  Musculoskeletal:  Negative for back pain and gait problem.  Skin:  Negative for rash.  Neurological:  Negative for dizziness and light-headedness.  All other systems reviewed and are negative.   Per HPI unless specifically indicated above   Allergies as of 06/06/2024   No Known Allergies      Medication List        Accurate as of June 06, 2024  3:47 PM. If you have any questions, ask your nurse or doctor.           cetirizine  10 MG tablet Commonly known as: ZYRTEC  Take 1 tablet (10 mg total) by mouth daily.   fluticasone  50 MCG/ACT nasal spray Commonly known as: FLONASE  Place 1 spray into both nostrils 2 (two) times daily as needed for allergies or rhinitis.   folic acid 1 MG tablet Commonly known as: FOLVITE Take 1 mg by mouth daily.   inFLIXimab  100 MG injection Commonly known as: REMICADE  Inject into the vein. Every 2 months   metFORMIN  500 MG tablet Commonly known as: GLUCOPHAGE  Take 1 tablet (500 mg total) by mouth 2 (two) times daily with a meal.   methotrexate 2.5 MG tablet Commonly known as: RHEUMATREX Take by mouth. 10 tablets once a week   mupirocin  ointment 2 % Commonly known as: BACTROBAN  Apply 1 Application topically 2 (two) times daily. Intranasal as needed   pantoprazole  40 MG tablet Commonly known as: PROTONIX  Take 1 tablet (40 mg total) by mouth daily.         Objective:   BP 132/80   Pulse 77   Ht 5' 7 (1.702 m)   Wt 179 lb (81.2 kg)   SpO2 96%   BMI 28.04 kg/m   Wt Readings from Last 3 Encounters:  06/06/24 179 lb (81.2 kg)  04/27/24 179 lb (81.2 kg)  02/24/24 180 lb (81.6 kg)    Physical Exam Vitals and nursing note reviewed.  Constitutional:  General: He is not in acute distress.    Appearance: He is well-developed. He is not diaphoretic.  Eyes:     General: No scleral icterus.    Conjunctiva/sclera: Conjunctivae normal.  Neck:     Thyroid : No thyromegaly.  Cardiovascular:     Rate and Rhythm: Normal rate and regular rhythm.     Heart sounds: Normal heart sounds. No murmur heard. Pulmonary:     Effort: Pulmonary effort is normal. No respiratory distress.     Breath sounds: Normal breath sounds. No wheezing.  Musculoskeletal:        General: No swelling. Normal range of motion.     Cervical back: Neck supple.  Lymphadenopathy:     Cervical: No cervical adenopathy.  Skin:    General: Skin is warm and dry.     Findings: No  rash.  Neurological:     Mental Status: He is alert and oriented to person, place, and time.     Coordination: Coordination normal.  Psychiatric:        Behavior: Behavior normal.     Results for orders placed or performed in visit on 05/31/24  HM DIABETES EYE EXAM   Collection Time: 04/25/24  2:16 PM  Result Value Ref Range   [facility] Diabetic Eye Exam No Retinopathy No Retinopathy    Assessment & Plan:   Problem List Items Addressed This Visit       Endocrine   Type 2 diabetes mellitus with other specified complication (HCC) - Primary   Relevant Orders   Bayer DCA Hb A1c Waived   CBC with Differential/Platelet   CMP14+EGFR   Lipid panel   Microalbumin/Creatinine Ratio, Urine     Other   Heartburn   Hay fever   Other Visit Diagnoses       Prostate cancer screening       Relevant Orders   PSA, total and free     Blood pressure looks good, heart rate looks good.  He seems to be doing well.  A1c is 7.3 which is actually better than last time, continue to focus on diet, no change in medicine.  Follow up plan: Return in about 3 months (around 09/06/2024), or if symptoms worsen or fail to improve, for Diabetes.  Counseling provided for all of the vaccine components Orders Placed This Encounter  Procedures   Bayer DCA Hb A1c Waived   CBC with Differential/Platelet   CMP14+EGFR   Lipid panel   PSA, total and free   Microalbumin/Creatinine Ratio, Urine    Fonda Levins, MD [facility] 06/06/2024, 3:47 PM

## 2024-06-07 LAB — CMP14+EGFR
ALT: 49 IU/L — AB (ref 0–44)
AST: 33 IU/L (ref 0–40)
Albumin: 4.2 g/dL (ref 3.9–4.9)
Alkaline Phosphatase: 86 IU/L (ref 44–121)
BUN/Creatinine Ratio: 18 (ref 10–24)
BUN: 19 mg/dL (ref 8–27)
Bilirubin Total: 0.4 mg/dL (ref 0.0–1.2)
CO2: 21 mmol/L (ref 20–29)
Calcium: 9.1 mg/dL (ref 8.6–10.2)
Chloride: 100 mmol/L (ref 96–106)
Creatinine, Ser: 1.03 mg/dL (ref 0.76–1.27)
Globulin, Total: 3.2 g/dL (ref 1.5–4.5)
Glucose: 172 mg/dL — AB (ref 70–99)
Potassium: 4 mmol/L (ref 3.5–5.2)
Sodium: 137 mmol/L (ref 134–144)
Total Protein: 7.4 g/dL (ref 6.0–8.5)
eGFR: 82 mL/min/1.73 (ref >59)

## 2024-06-07 LAB — CBC WITH DIFFERENTIAL/PLATELET
Basophils Absolute: 0.1 x10E3/uL (ref 0.0–0.2)
Basos: 1 %
EOS (ABSOLUTE): 0.4 x10E3/uL (ref 0.0–0.4)
Eos: 4 %
Hematocrit: 45 % (ref 37.5–51.0)
Hemoglobin: 14.7 g/dL (ref 13.0–17.7)
Immature Grans (Abs): 0 x10E3/uL (ref 0.0–0.1)
Immature Granulocytes: 0 %
Lymphocytes Absolute: 3.8 x10E3/uL — ABNORMAL HIGH (ref 0.7–3.1)
Lymphs: 46 %
MCH: 30.8 pg (ref 26.6–33.0)
MCHC: 32.7 g/dL (ref 31.5–35.7)
MCV: 94 fL (ref 79–97)
Monocytes Absolute: 0.7 x10E3/uL (ref 0.1–0.9)
Monocytes: 8 %
Neutrophils Absolute: 3.4 x10E3/uL (ref 1.4–7.0)
Neutrophils: 41 %
Platelets: 194 x10E3/uL (ref 150–450)
RBC: 4.78 x10E6/uL (ref 4.14–5.80)
RDW: 12.7 % (ref 11.6–15.4)
WBC: 8.4 x10E3/uL (ref 3.4–10.8)

## 2024-06-07 LAB — LIPID PANEL
Cholesterol, Total: 145 mg/dL (ref 100–199)
HDL: 29 mg/dL — AB (ref >39)
LDL CALC COMMENT:: 5 ratio (ref 0.0–5.0)
LDL Chol Calc (NIH): 77 mg/dL (ref 0–99)
Triglycerides: 235 mg/dL — AB (ref 0–149)
VLDL Cholesterol Cal: 39 mg/dL (ref 5–40)

## 2024-06-07 LAB — MICROALBUMIN / CREATININE URINE RATIO
Creatinine, Urine: 72.9 mg/dL
Microalb/Creat Ratio: <4 mg/g{creat} (ref 0–29)
Microalbumin, Urine: <3 ug/mL

## 2024-06-07 LAB — BAYER DCA HB A1C WAIVED: HB A1C (BAYER DCA - WAIVED): 7.3 % — ABNORMAL HIGH (ref 4.8–5.6)

## 2024-06-07 LAB — PSA, TOTAL AND FREE
PSA, Free Pct: 27
PSA, Free: 0.27 ng/mL
Prostate Specific Ag, Serum: 1 ng/mL (ref 0.0–4.0)

## 2024-06-14 ENCOUNTER — Ambulatory Visit: Payer: Self-pay | Admitting: Family Medicine

## 2024-09-06 ENCOUNTER — Encounter: Payer: Self-pay | Admitting: Family Medicine

## 2024-09-06 ENCOUNTER — Ambulatory Visit: Admitting: Family Medicine

## 2024-09-06 VITALS — BP 123/65 | HR 63 | Ht 67.0 in | Wt 177.0 lb

## 2024-09-06 DIAGNOSIS — E1169 Type 2 diabetes mellitus with other specified complication: Secondary | ICD-10-CM

## 2024-09-06 DIAGNOSIS — Z23 Encounter for immunization: Secondary | ICD-10-CM

## 2024-09-06 DIAGNOSIS — R12 Heartburn: Secondary | ICD-10-CM

## 2024-09-06 DIAGNOSIS — B356 Tinea cruris: Secondary | ICD-10-CM | POA: Diagnosis not present

## 2024-09-06 LAB — BAYER DCA HB A1C WAIVED: HB A1C (BAYER DCA - WAIVED): 6.9 % — ABNORMAL HIGH (ref 4.8–5.6)

## 2024-09-06 MED ORDER — FLUTICASONE PROPIONATE 0.05 % EX CREA: TOPICAL_CREAM | Freq: Two times a day (BID) | CUTANEOUS | 1 refills | Status: DC

## 2024-09-06 MED ORDER — KETOCONAZOLE 2 % EX CREA: 1.0000 | TOPICAL_CREAM | Freq: Every day | CUTANEOUS | 1 refills | Status: DC

## 2024-09-06 NOTE — Progress Notes (Signed)
 BP 123/65   Pulse 63   Ht 5' 7 (1.702 m)   Wt 177 lb (80.3 kg)   SpO2 96%   BMI 27.72 kg/m    Subjective:   Patient ID: Christopher Haas, male    DOB: 03/31/1961, 63 y.o.   MRN: [id number]  HPI: Christopher Haas is a 63 y.o. male presenting on 09/06/2024 for Medical Management of Chronic Issues and Diabetes   Discussed the use of AI scribe software for clinical note transcription with the patient, who gave verbal consent to proceed.  History of Present Illness   [name] is a 63 year old male with diabetes who presents for a follow-up on his blood sugar levels.  Hyperglycemia - Diabetes managed with Metformin , duration of therapy unknown - Awaiting further evaluation of blood glucose levels - No current symptoms related to hyperglycemia - Feels well overall  Gastroesophageal reflux symptoms - Takes Protonix  and Metoprolol for reflux - Medications are effective in controlling symptoms - Requests refills for Protonix  and Metoprolol  Allergic symptoms - Uses allergy medications as needed - Medications are effective when symptoms arise  Dermatitis - Experiences skin irritation when working outside - Uses a cream prescribed by another physician for relief - Applies the cream by mixing it in a container  Pain assessment - No pain reported          Relevant past medical, surgical, family and social history reviewed and updated as indicated. Interim medical history since our last visit reviewed. Allergies and medications reviewed and updated.  Review of Systems  Constitutional:  Negative for chills and fever.  Eyes:  Negative for visual disturbance.  Respiratory:  Negative for shortness of breath and wheezing.   Cardiovascular:  Negative for chest pain and leg swelling.  Musculoskeletal:  Negative for back pain and gait problem.  Skin:  Positive for rash.  Neurological:  Negative for dizziness, weakness and light-headedness.  All other systems reviewed  and are negative.   Per HPI unless specifically indicated above   Allergies as of 09/06/2024   No Known Allergies      Medication List        Accurate as of September 06, 2024  3:52 PM. If you have any questions, ask your nurse or doctor.          cetirizine  10 MG tablet Commonly known as: ZYRTEC  Take 1 tablet (10 mg total) by mouth daily.   fluticasone  0.05 % cream Commonly known as: CUTIVATE  Apply topically 2 (two) times daily. Started by: Fonda LABOR Jayna Mulnix   fluticasone  50 MCG/ACT nasal spray Commonly known as: FLONASE  Place 1 spray into both nostrils 2 (two) times daily as needed for allergies or rhinitis.   folic acid 1 MG tablet Commonly known as: FOLVITE Take 1 mg by mouth daily.   inFLIXimab  100 MG injection Commonly known as: REMICADE  Inject into the vein. Every 2 months   ketoconazole  2 % cream Commonly known as: NIZORAL  Apply 1 Application topically daily. Started by: Fonda LABOR Samiel Peel   metFORMIN  500 MG tablet Commonly known as: GLUCOPHAGE  Take 1 tablet (500 mg total) by mouth 2 (two) times daily with a meal.   methotrexate 2.5 MG tablet Commonly known as: RHEUMATREX Take by mouth. 10 tablets once a week   mupirocin  ointment 2 % Commonly known as: BACTROBAN  Apply 1 Application topically 2 (two) times daily. Intranasal as needed   pantoprazole  40 MG tablet Commonly known as: PROTONIX  Take 1 tablet (40 mg total) by mouth  daily.         Objective:   BP 123/65   Pulse 63   Ht 5' 7 (1.702 m)   Wt 177 lb (80.3 kg)   SpO2 96%   BMI 27.72 kg/m   Wt Readings from Last 3 Encounters:  09/06/24 177 lb (80.3 kg)  06/06/24 179 lb (81.2 kg)  04/27/24 179 lb (81.2 kg)    Physical Exam Physical Exam   VITALS: BP- 123/65         Assessment & Plan:   Problem List Items Addressed This Visit       Endocrine   Type 2 diabetes mellitus with other specified complication (HCC) - Primary   Relevant Orders   Bayer DCA Hb A1c Waived      Other   Heartburn   Other Visit Diagnoses       Jock itch       Relevant Medications   ketoconazole  (NIZORAL ) 2 % cream   fluticasone  (CUTIVATE ) 0.05 % cream          Type 2 diabetes mellitus Diabetes well-managed with metformin . - Continue metformin . A1c of 6.9 which is much better than last time at 7.3.  Gastroesophageal reflux disease (GERD) GERD controlled with Protonix  and metoprolol. - Continue Protonix . - Continue metoprolol. - Refill prescriptions for Protonix  and metoprolol.  Allergic rhinitis Allergic rhinitis effectively managed with as-needed medication. - Continue allergy medication as needed.      Patient gets the occasional jock itch and uses fluticasone  and ketoconazole  cream and mix them together and it helps when he gets it.  He does use them as needed.     Follow up plan: Return in about 3 months (around 12/07/2024), or if symptoms worsen or fail to improve, for Diabetes recheck.  Counseling provided for all of the vaccine components Orders Placed This Encounter  Procedures   Bayer DCA Hb A1c Waived    Fonda Levins, MD [facility] 09/06/2024, 3:52 PM

## 2024-09-27 ENCOUNTER — Ambulatory Visit: Admitting: Family

## 2024-09-27 ENCOUNTER — Encounter: Payer: Self-pay | Admitting: Family

## 2024-09-27 VITALS — BP 120/86 | HR 57 | Temp 97.5°F | Wt 175.8 lb

## 2024-09-27 DIAGNOSIS — H6991 Unspecified Eustachian tube disorder, right ear: Secondary | ICD-10-CM

## 2024-09-27 DIAGNOSIS — H1131 Conjunctival hemorrhage, right eye: Secondary | ICD-10-CM

## 2024-09-27 MED ORDER — CETIRIZINE HCL 10 MG PO TABS: 10.0000 mg | ORAL_TABLET | Freq: Every day | ORAL | 3 refills | Status: AC

## 2024-09-27 MED ORDER — FLUTICASONE PROPIONATE 50 MCG/ACT NA SUSP: 1.0000 | Freq: Two times a day (BID) | NASAL | 6 refills | Status: AC | PRN

## 2024-09-27 NOTE — Patient Instructions (Addendum)
 Eustachian Tube Dysfunction  Eustachian tube dysfunction refers to a condition in which a blockage develops in the narrow passage that connects the middle ear to the back of the nose (eustachian tube). The eustachian tube regulates air pressure in the middle ear by letting air move between the ear and nose. It also helps to drain fluid from the middle ear space. Eustachian tube dysfunction can affect one or both ears. When the eustachian tube does not function properly, air pressure, fluid, or both can build up in the middle ear. What are the causes? This condition occurs when the eustachian tube becomes blocked or cannot open normally. Common causes of this condition include: Ear infections. Colds and other infections that affect the nose, mouth, and throat (upper respiratory tract). Allergies. Irritation from cigarette smoke. Irritation from stomach acid coming up into the esophagus (gastroesophageal reflux). The esophagus is the part of the body that moves food from the mouth to the stomach. Sudden changes in air pressure, such as from descending in an airplane or scuba diving. Abnormal growths in the nose or throat, such as: Growths that line the nose (nasal polyps). Abnormal growth of cells (tumors). Enlarged tissue at the back of the throat (adenoids). What increases the risk? You are more likely to develop this condition if: You smoke. You are overweight. You are a child who has: Certain birth defects of the mouth, such as cleft palate. Large tonsils or adenoids. What are the signs or symptoms? Common symptoms of this condition include: A feeling of fullness in the ear. Ear pain. Clicking or popping noises in the ear. Ringing in the ear (tinnitus). Hearing loss. Loss of balance. Dizziness. Symptoms may get worse when the air pressure around you changes, such as when you travel to an area of high elevation, fly on an airplane, or go scuba diving. How is this diagnosed? This  condition may be diagnosed based on: Your symptoms. A physical exam of your ears, nose, and throat. Tests, such as those that measure: The movement of your eardrum. Your hearing (audiometry). How is this treated? Treatment depends on the cause and severity of your condition. In mild cases, you may relieve your symptoms by moving air into your ears. This is called popping the ears. In more severe cases, or if you have symptoms of fluid in your ears, treatment may include: Medicines to relieve congestion (decongestants). Medicines that treat allergies (antihistamines). Nasal sprays or ear drops that contain medicines that reduce swelling (steroids). A procedure to drain the fluid in your eardrum. In this procedure, a small tube may be placed in the eardrum to: Drain the fluid. Restore the air in the middle ear space. A procedure to insert a balloon device through the nose to inflate the opening of the eustachian tube (balloon dilation). Follow these instructions at home: Lifestyle Do not do any of the following until your health care provider approves: Travel to high altitudes. Fly in airplanes. Work in a estate agent or room. Scuba dive. Do not use any products that contain nicotine or tobacco. These products include cigarettes, chewing tobacco, and vaping devices, such as e-cigarettes. If you need help quitting, ask your health care provider. Keep your ears dry. Wear fitted earplugs during showering and bathing. Dry your ears completely after. General instructions Take over-the-counter and prescription medicines only as told by your health care provider. Use techniques to help pop your ears as recommended by your health care provider. These may include: Chewing gum. Yawning. Frequent, forceful swallowing.  Closing your mouth, holding your nose closed, and gently blowing as if you are trying to blow air out of your nose. Keep all follow-up visits. This is important. Contact a  health care provider if: Your symptoms do not go away after treatment. Your symptoms come back after treatment. You are unable to pop your ears. You have: A fever. Pain in your ear. Pain in your head or neck. Fluid draining from your ear. Your hearing suddenly changes. You become very dizzy. You lose your balance. Get help right away if: You have a sudden, severe increase in any of your symptoms. Summary Eustachian tube dysfunction refers to a condition in which a blockage develops in the eustachian tube. It can be caused by ear infections, allergies, inhaled irritants, or abnormal growths in the nose or throat. Symptoms may include ear pain or fullness, hearing loss, or ringing in the ears. Mild cases are treated with techniques to unblock the ears, such as yawning or chewing gum. More severe cases are treated with medicines or procedures. This information is not intended to replace advice given to you by your health care provider. Make sure you discuss any questions you have with your health care provider.  Broken Blood Vessels in the Eye (Subconjunctival Hemorrhage): What to Know A subconjunctival hemorrhage happens when tiny blood vessels near the surface of your eye break and bleed. This creates a red patch on the white part of your eye. The redness may cover the entire white part of your eye or a small area. This is similar to a bruise. Although it might look bad, this injury is often harmless and doesn't affect your eyesight or cause pain. It often goes away on its own within 2-4 weeks. What are the causes? A subconjunctival hemorrhage may be caused by: Mild trauma, such as rubbing your eye too hard. Blunt injuries, such as ones that can happen during sports. Coughing, sneezing, or throwing up. Straining, such as when lifting a heavy object. Medical problems, such as: High blood pressure. Bleeding disorders. Diabetes. Recent eye surgery. Some medicines, especially blood  thinners. This includes aspirin. Sometimes, this can happen without a clear cause. What are the signs or symptoms?  A bright or dark red patch on the white part of the eye. Swelling around the eye, if there's a lot of bleeding. Mild eye irritation. How is this diagnosed? Your health care provider may diagnose you based on your symptoms and a physical exam. If your injury was caused by trauma, your provider may have you see an eye specialist called an ophthalmologist. Other tests may include: An eye exam, including a vision test. A blood pressure check. Blood tests to check for bleeding disorders. X-rays or a CT scan to check for other injuries. How is this treated? Treatment isn't usually needed. If you have discomfort, your provider may recommend eye drops or putting cold, wet cloths on the affected eye. Follow these instructions at home: Take your medicines only as told. Use eye drops or apply a cold, wet cloth to help with discomfort as told. Avoid activities or places that bother or could injure your eye. Contact a health care provider if: You have pain in your eye. The bleeding doesn't go away within 4 weeks. You keep getting new subconjunctival hemorrhages. Get help right away if: Your vision changes or you have double vision. You're suddenly sensitive to light. You have a very bad headache or keep throwing up. You're confused or more tired than normal. Your eye  seems swollen or sticks out. You have unexplained bruises or bleeding in another area of your body. These symptoms may be an emergency. Call 911 right away. Do not wait to see if the symptoms will go away. Do not drive yourself to the hospital. This information is not intended to replace advice given to you by your health care provider. Make sure you discuss any questions you have with your health care provider. Document Revised: 07/04/2023 Document Reviewed: 07/04/2023 Elsevier Patient Education  2024 [employer] Document Revised: 01/19/2021 Document Reviewed: 01/19/2021 Elsevier Patient Education  2024 [employer].

## 2024-09-27 NOTE — Progress Notes (Signed)
 Subjective:    Patient ID: Christopher Haas, male    DOB: 09/27/1961, 63 y.o.   MRN: [id number]  Chief Complaint  Patient presents with   Ear Pain   Eye Pain   Pt presents to the office today with right ear pain that started three days ago.  Also, this AM noticed a spot on his right eye. Denies any discharge or pain.  Otalgia  There is pain in the right ear. This is a new problem. The current episode started in the past 7 days. The problem occurs constantly. The problem has been gradually improving. There has been no fever. The pain is at a severity of 8/10. The pain is mild. Pertinent negatives include no coughing, ear discharge, headaches, hearing loss, rhinorrhea or sore throat. He has tried nothing for the symptoms. The treatment provided no relief.      Review of Systems  HENT:  Positive for ear pain. Negative for ear discharge, hearing loss, rhinorrhea and sore throat.   Respiratory:  Negative for cough.   Neurological:  Negative for headaches.  All other systems reviewed and are negative.   Social History   Socioeconomic History   Marital status: Married    Spouse name: Not on file   Number of children: Not on file   Years of education: Not on file   Highest education level: Not on file  Occupational History   Not on file  Tobacco Use   Smoking status: Never   Smokeless tobacco: Never  Substance and Sexual Activity   Alcohol use: No   Drug use: No   Sexual activity: Not on file  Other Topics Concern   Not on file  Social History Narrative   Not on file   Social Drivers of Health   Financial Resource Strain: Not on file  Food Insecurity: Not on file  Transportation Needs: Not on file  Physical Activity: Not on file  Stress: Not on file  Social Connections: Not on file   No family history on file.      Objective:   Physical Exam Vitals reviewed.  Constitutional:      General: He is not in acute distress.    Appearance: He is well-developed.   HENT:     Head: Normocephalic.     Right Ear: A middle ear effusion is present.     Left Ear: Tympanic membrane normal.  Eyes:     General:        Right eye: No discharge.        Left eye: No discharge.     Pupils: Pupils are equal, round, and reactive to light.      Comments: Right blood vessel broken   Neck:     Thyroid : No thyromegaly.  Cardiovascular:     Rate and Rhythm: Normal rate and regular rhythm.     Heart sounds: Normal heart sounds. No murmur heard. Pulmonary:     Effort: Pulmonary effort is normal. No respiratory distress.     Breath sounds: Normal breath sounds. No wheezing.  Abdominal:     General: Bowel sounds are normal. There is no distension.     Palpations: Abdomen is soft.     Tenderness: There is no abdominal tenderness.  Musculoskeletal:        General: No tenderness. Normal range of motion.     Cervical back: Normal range of motion and neck supple.  Skin:    General: Skin is warm and dry.  Findings: No erythema or rash.  Neurological:     Mental Status: He is alert and oriented to person, place, and time.     Cranial Nerves: No cranial nerve deficit.     Deep Tendon Reflexes: Reflexes are normal and symmetric.  Psychiatric:        Behavior: Behavior normal.        Thought Content: Thought content normal.        Judgment: Judgment normal.       BP 120/86   Pulse (!) 57   Temp (!) 97.5 F (36.4 C) (Other (Comment))   Wt 175 lb 12.8 oz (79.7 kg)   SpO2 97%   BMI 27.53 kg/m      Assessment & Plan:  Christopher Haas comes in today with chief complaint of Ear Pain and Eye Pain   Diagnosis and orders addressed:  1. Eustachian tube disorder, right (Primary) Start zrytec and flonase  - Take meds as prescribed - Use a cool mist humidifier  -Use saline nose sprays frequently -Force fluids -For any cough or congestion  Use plain Mucinex - regular strength or max strength is fine -For fever or aces or pains- take tylenol or  ibuprofen. -Throat lozenges if help -Follow up if symptoms worsen or do not improve  - cetirizine  (ZYRTEC ) 10 MG tablet; Take 1 tablet (10 mg total) by mouth daily.  Dispense: 90 tablet; Refill: 3 - fluticasone  (FLONASE ) 50 MCG/ACT nasal spray; Place 1 spray into both nostrils 2 (two) times daily as needed for allergies or rhinitis.  Dispense: 16 g; Refill: 6  2. Subconjunctival hemorrhage of right eye Avoid rubbing  Will resolve in 2 weeks       Christopher Learn, FNP

## 2024-10-20 ENCOUNTER — Other Ambulatory Visit: Payer: Self-pay | Admitting: Family Medicine

## 2024-10-20 DIAGNOSIS — B356 Tinea cruris: Secondary | ICD-10-CM

## 2024-12-10 ENCOUNTER — Encounter: Payer: Self-pay | Admitting: Family Medicine

## 2024-12-10 ENCOUNTER — Ambulatory Visit: Payer: Self-pay | Admitting: Family Medicine

## 2024-12-10 VITALS — BP 132/68 | HR 64 | Ht 67.0 in | Wt 178.0 lb

## 2024-12-10 DIAGNOSIS — E1169 Type 2 diabetes mellitus with other specified complication: Secondary | ICD-10-CM

## 2024-12-10 DIAGNOSIS — Z7984 Long term (current) use of oral hypoglycemic drugs: Secondary | ICD-10-CM

## 2024-12-10 DIAGNOSIS — K2100 Gastro-esophageal reflux disease with esophagitis, without bleeding: Secondary | ICD-10-CM | POA: Diagnosis not present

## 2024-12-10 DIAGNOSIS — R12 Heartburn: Secondary | ICD-10-CM

## 2024-12-10 LAB — BAYER DCA HB A1C WAIVED: HB A1C (BAYER DCA - WAIVED): 7.3 % — ABNORMAL HIGH (ref 4.8–5.6)

## 2024-12-10 MED ORDER — METFORMIN HCL 500 MG PO TABS: 500.0000 mg | ORAL_TABLET | Freq: Two times a day (BID) | ORAL | 3 refills | Status: AC

## 2024-12-10 MED ORDER — PANTOPRAZOLE SODIUM 40 MG PO TBEC: 40.0000 mg | DELAYED_RELEASE_TABLET | Freq: Every day | ORAL | 3 refills | Status: AC

## 2024-12-10 NOTE — Progress Notes (Signed)
 "  BP 132/68   Pulse 64   Ht 5' 7 (1.702 m)   Wt 178 lb (80.7 kg)   SpO2 98%   BMI 27.88 kg/m    Subjective:   Patient ID: Christopher Haas, male    DOB: 1961/08/27, 64 y.o.   MRN: [id number]  HPI: Christopher Haas is a 64 y.o. male presenting on 12/10/2024 for Medical Management of Chronic Issues and Diabetes   Discussed the use of AI scribe software for clinical note transcription with the patient, who gave verbal consent to proceed.  History of Present Illness   Christopher Haas is a 64 year old male with diabetes who presents for a follow-up on his blood sugar levels.  Glycemic control - Diabetes mellitus managed with metformin  twice daily, morning and evening. - No negative symptoms related to blood sugar levels. - Feels well overall.  Xerostomia - Dry mouth at night, waking him up three to four times, though not every night. - Dryness affects mouth and lips.  Gastroesophageal symptoms - Experiences acidity, managed with daily 'Protol' pill.  Thermoregulatory sensations - No congestion present. - Sometimes feels heat when it is cold outside.  Environmental factors - Has a machine that blows air, uncertain if it is a humidifier.          Relevant past medical, surgical, family and social history reviewed and updated as indicated. Interim medical history since our last visit reviewed. Allergies and medications reviewed and updated.  Review of Systems  Constitutional:  Negative for chills and fever.  Eyes:  Negative for visual disturbance.  Respiratory:  Negative for shortness of breath and wheezing.   Cardiovascular:  Negative for chest pain and leg swelling.  Musculoskeletal:  Negative for back pain and gait problem.  Skin:  Negative for rash.  Neurological:  Negative for dizziness and light-headedness.  All other systems reviewed and are negative.   Per HPI unless specifically indicated above   Allergies as of 12/10/2024   No Known Allergies       Medication List        Accurate as of December 10, 2024  4:03 PM. If you have any questions, ask your nurse or doctor.          cetirizine  10 MG tablet Commonly known as: ZYRTEC  Take 1 tablet (10 mg total) by mouth daily.   fluticasone  0.05 % cream Commonly known as: CUTIVATE  APPLY TWICE DAILY   fluticasone  50 MCG/ACT nasal spray Commonly known as: FLONASE  Place 1 spray into both nostrils 2 (two) times daily as needed for allergies or rhinitis.   folic acid 1 MG tablet Commonly known as: FOLVITE Take 1 mg by mouth daily.   inFLIXimab  100 MG injection Commonly known as: REMICADE  Inject into the vein. Every 2 months   ketoconazole  2 % cream Commonly known as: NIZORAL  APPLY ONCE DAILY   metFORMIN  500 MG tablet Commonly known as: GLUCOPHAGE  Take 1 tablet (500 mg total) by mouth 2 (two) times daily with a meal.   methotrexate 2.5 MG tablet Commonly known as: RHEUMATREX Take by mouth. 10 tablets once a week   mupirocin  ointment 2 % Commonly known as: BACTROBAN  Apply 1 Application topically 2 (two) times daily. Intranasal as needed   pantoprazole  40 MG tablet Commonly known as: PROTONIX  Take 1 tablet (40 mg total) by mouth daily.         Objective:   BP 132/68   Pulse 64   Ht 5' 7 (1.702 m)   Wt  178 lb (80.7 kg)   SpO2 98%   BMI 27.88 kg/m   Wt Readings from Last 3 Encounters:  12/10/24 178 lb (80.7 kg)  09/27/24 175 lb 12.8 oz (79.7 kg)  09/06/24 177 lb (80.3 kg)    Physical Exam Physical Exam   CHEST: Lungs clear to auscultation bilaterally. CARDIOVASCULAR: Heart regular rate and rhythm, normal heart sounds.       Results for orders placed or performed in visit on 09/06/24  Bayer DCA Hb A1c Waived   Collection Time: 09/06/24  3:35 PM  Result Value Ref Range   HB A1C (BAYER DCA - WAIVED) 6.9 (H) 4.8 - 5.6 %    Assessment & Plan:   Problem List Items Addressed This Visit       Endocrine   Type 2 diabetes mellitus with other specified  complication (HCC) - Primary   Relevant Medications   metFORMIN  (GLUCOPHAGE ) 500 MG tablet   Other Relevant Orders   Bayer DCA Hb A1c Waived   Lipid panel   TSH   Vitamin B12   Microalbumin / creatinine urine ratio     Other   Heartburn   Relevant Medications   pantoprazole  (PROTONIX ) 40 MG tablet   Other Visit Diagnoses       Gastroesophageal reflux disease with esophagitis without hemorrhage       Relevant Medications   pantoprazole  (PROTONIX ) 40 MG tablet          Type 2 diabetes mellitus Well-controlled with metformin . Occasional dry mouth possibly related to GERD. - Continue metformin  twice daily.  Gastroesophageal reflux disease with esophagitis Managed with Protol. Discussed humidifier use for dry mouth. Considered endoscopy if symptoms persist. - Use humidifier at night. - Continue Protol as needed. - Consider gastroenterologist referral for endoscopy if no improvement.     A1c is 7.3 which is slightly up, focus on diet.     Follow up plan: Return in about 3 months (around 03/10/2025), or if symptoms worsen or fail to improve, for Diabetes recheck.  Counseling provided for all of the vaccine components Orders Placed This Encounter  Procedures   Bayer DCA Hb A1c Waived   Lipid panel   TSH   Vitamin B12   Microalbumin / creatinine urine ratio    Fonda Levins, MD [facility] 12/10/2024, 4:03 PM     "

## 2024-12-11 LAB — LIPID PANEL
Chol/HDL Ratio: 5.4 ratio — ABNORMAL HIGH (ref 0.0–5.0)
Cholesterol, Total: 156 mg/dL (ref 100–199)
HDL: 29 mg/dL — ABNORMAL LOW
LDL Chol Calc (NIH): 82 mg/dL (ref 0–99)
Triglycerides: 270 mg/dL — ABNORMAL HIGH (ref 0–149)
VLDL Cholesterol Cal: 45 mg/dL — ABNORMAL HIGH (ref 5–40)

## 2024-12-11 LAB — TSH: TSH: 3.8 u[IU]/mL (ref 0.450–4.500)

## 2024-12-11 LAB — MICROALBUMIN / CREATININE URINE RATIO
Creatinine, Urine: 134.3 mg/dL
Microalb/Creat Ratio: 3 mg/g{creat} (ref 0–29)
Microalbumin, Urine: 4.3 ug/mL

## 2024-12-11 LAB — VITAMIN B12: Vitamin B-12: 979 pg/mL (ref 232–1245)

## 2024-12-19 ENCOUNTER — Ambulatory Visit: Payer: Self-pay | Admitting: Family Medicine

## 2025-03-11 ENCOUNTER — Ambulatory Visit: Admitting: Family Medicine
